# Patient Record
Sex: Male | Born: 1945 | Race: White | Hispanic: No | Marital: Married | Smoking: Former smoker
Health system: Southern US, Community
[De-identification: ages and names within clinical notes are randomized; demographics above are authoritative.]

## PROBLEM LIST (undated history)

## (undated) DIAGNOSIS — I739 Peripheral vascular disease, unspecified: Secondary | ICD-10-CM

## (undated) DIAGNOSIS — E785 Hyperlipidemia, unspecified: Secondary | ICD-10-CM

## (undated) DIAGNOSIS — J449 Chronic obstructive pulmonary disease, unspecified: Secondary | ICD-10-CM

## (undated) DIAGNOSIS — F32A Depression, unspecified: Secondary | ICD-10-CM

## (undated) DIAGNOSIS — IMO0002 Reserved for concepts with insufficient information to code with codable children: Secondary | ICD-10-CM

## (undated) DIAGNOSIS — D689 Coagulation defect, unspecified: Secondary | ICD-10-CM

## (undated) DIAGNOSIS — S8291XA Unspecified fracture of right lower leg, initial encounter for closed fracture: Secondary | ICD-10-CM

## (undated) DIAGNOSIS — I82409 Acute embolism and thrombosis of unspecified deep veins of unspecified lower extremity: Secondary | ICD-10-CM

## (undated) DIAGNOSIS — F329 Major depressive disorder, single episode, unspecified: Secondary | ICD-10-CM

## (undated) DIAGNOSIS — F419 Anxiety disorder, unspecified: Secondary | ICD-10-CM

## (undated) DIAGNOSIS — I2510 Atherosclerotic heart disease of native coronary artery without angina pectoris: Secondary | ICD-10-CM

## (undated) DIAGNOSIS — S8292XA Unspecified fracture of left lower leg, initial encounter for closed fracture: Secondary | ICD-10-CM

## (undated) HISTORY — DX: Hyperlipidemia, unspecified: E78.5

## (undated) HISTORY — DX: Unspecified fracture of left lower leg, initial encounter for closed fracture: S82.92XA

## (undated) HISTORY — PX: PR VEIN BYPASS GRAFT,AORTO-FEM-POP: 35551

## (undated) HISTORY — DX: Peripheral vascular disease, unspecified: I73.9

## (undated) HISTORY — PX: NECK SURGERY: SHX720

## (undated) HISTORY — DX: Coagulation defect, unspecified: D68.9

## (undated) HISTORY — DX: Reserved for concepts with insufficient information to code with codable children: IMO0002

## (undated) HISTORY — DX: Acute embolism and thrombosis of unspecified deep veins of unspecified lower extremity: I82.409

## (undated) HISTORY — DX: Unspecified fracture of right lower leg, initial encounter for closed fracture: S82.91XA

## (undated) HISTORY — PX: FRACTURE SURGERY: SHX138

## (undated) HISTORY — PX: OTHER SURGICAL HISTORY: SHX169

## (undated) HISTORY — DX: Atherosclerotic heart disease of native coronary artery without angina pectoris: I25.10

---

## 1998-06-06 ENCOUNTER — Encounter: Payer: Self-pay | Admitting: *Deleted

## 1998-06-06 ENCOUNTER — Ambulatory Visit (HOSPITAL_COMMUNITY): Admission: RE | Admit: 1998-06-06 | Discharge: 1998-06-06 | Payer: Self-pay | Admitting: *Deleted

## 2003-11-15 ENCOUNTER — Ambulatory Visit (HOSPITAL_COMMUNITY): Admission: RE | Admit: 2003-11-15 | Discharge: 2003-11-16 | Payer: Self-pay | Admitting: Neurosurgery

## 2010-07-21 ENCOUNTER — Ambulatory Visit: Payer: Self-pay | Admitting: Cardiovascular Disease

## 2010-07-24 ENCOUNTER — Ambulatory Visit: Payer: Self-pay | Admitting: Vascular Surgery

## 2010-07-27 ENCOUNTER — Ambulatory Visit: Payer: Self-pay | Admitting: Cardiovascular Disease

## 2010-07-30 ENCOUNTER — Telehealth (INDEPENDENT_AMBULATORY_CARE_PROVIDER_SITE_OTHER): Payer: Self-pay | Admitting: *Deleted

## 2010-07-30 ENCOUNTER — Ambulatory Visit (HOSPITAL_COMMUNITY)
Admission: RE | Admit: 2010-07-30 | Discharge: 2010-07-30 | Payer: Self-pay | Source: Home / Self Care | Admitting: Vascular Surgery

## 2010-07-30 ENCOUNTER — Ambulatory Visit: Payer: Self-pay | Admitting: Vascular Surgery

## 2010-07-30 HISTORY — PX: OTHER SURGICAL HISTORY: SHX169

## 2010-08-03 ENCOUNTER — Encounter: Payer: Self-pay | Admitting: *Deleted

## 2010-08-03 ENCOUNTER — Ambulatory Visit: Payer: Self-pay | Admitting: Cardiology

## 2010-08-03 ENCOUNTER — Encounter: Payer: Self-pay | Admitting: Cardiovascular Disease

## 2010-08-03 ENCOUNTER — Encounter (HOSPITAL_COMMUNITY)
Admission: RE | Admit: 2010-08-03 | Discharge: 2010-10-17 | Payer: Self-pay | Source: Home / Self Care | Attending: Cardiovascular Disease | Admitting: Cardiovascular Disease

## 2010-08-03 ENCOUNTER — Ambulatory Visit: Payer: Self-pay

## 2010-08-03 HISTORY — PX: CARDIOVASCULAR STRESS TEST: SHX262

## 2010-08-07 ENCOUNTER — Ambulatory Visit: Payer: Self-pay | Admitting: Vascular Surgery

## 2010-08-17 ENCOUNTER — Encounter: Payer: Self-pay | Admitting: Vascular Surgery

## 2010-08-17 ENCOUNTER — Inpatient Hospital Stay (HOSPITAL_COMMUNITY)
Admission: RE | Admit: 2010-08-17 | Discharge: 2010-08-25 | Payer: Self-pay | Source: Home / Self Care | Admitting: Vascular Surgery

## 2010-08-19 ENCOUNTER — Ambulatory Visit: Payer: Self-pay | Admitting: Oncology

## 2010-08-19 ENCOUNTER — Encounter: Payer: Self-pay | Admitting: Vascular Surgery

## 2010-08-19 ENCOUNTER — Ambulatory Visit: Payer: Self-pay | Admitting: Vascular Surgery

## 2010-08-20 ENCOUNTER — Encounter: Payer: Self-pay | Admitting: Vascular Surgery

## 2010-08-25 ENCOUNTER — Ambulatory Visit: Payer: Self-pay | Admitting: Oncology

## 2010-08-28 ENCOUNTER — Ambulatory Visit: Payer: Self-pay | Admitting: Oncology

## 2010-08-28 ENCOUNTER — Ambulatory Visit: Payer: Self-pay | Admitting: Vascular Surgery

## 2010-08-28 LAB — PROTIME-INR: Protime: 36 Seconds — ABNORMAL HIGH (ref 10.6–13.4)

## 2010-08-31 LAB — CBC WITH DIFFERENTIAL/PLATELET
BASO%: 0.4 % (ref 0.0–2.0)
Basophils Absolute: 0.1 10*3/uL (ref 0.0–0.1)
LYMPH%: 17.4 % (ref 14.0–49.0)
MCH: 30.6 pg (ref 27.2–33.4)
MCHC: 33.4 g/dL (ref 32.0–36.0)
MCV: 91.4 fL (ref 79.3–98.0)
MONO#: 1.1 10*3/uL — ABNORMAL HIGH (ref 0.1–0.9)
NEUT#: 10.3 10*3/uL — ABNORMAL HIGH (ref 1.5–6.5)
NEUT%: 72.4 % (ref 39.0–75.0)
lymph#: 2.5 10*3/uL (ref 0.9–3.3)

## 2010-08-31 LAB — PROTIME-INR: INR: 1.6 — ABNORMAL LOW (ref 2.00–3.50)

## 2010-09-03 LAB — PROTIME-INR: INR: 2 (ref 2.00–3.50)

## 2010-09-04 ENCOUNTER — Encounter
Admission: RE | Admit: 2010-09-04 | Discharge: 2010-09-04 | Payer: Self-pay | Source: Home / Self Care | Admitting: Vascular Surgery

## 2010-09-04 ENCOUNTER — Ambulatory Visit: Payer: Self-pay | Admitting: Vascular Surgery

## 2010-09-09 LAB — PROTIME-INR: Protime: 28.8 Seconds — ABNORMAL HIGH (ref 10.6–13.4)

## 2010-09-15 LAB — CBC WITH DIFFERENTIAL/PLATELET
BASO%: 0.9 % (ref 0.0–2.0)
EOS%: 3.1 % (ref 0.0–7.0)
HCT: 39.1 % (ref 38.4–49.9)
LYMPH%: 29.6 % (ref 14.0–49.0)
MCH: 30.1 pg (ref 27.2–33.4)
MCHC: 33.2 g/dL (ref 32.0–36.0)
MCV: 90.5 fL (ref 79.3–98.0)
MONO%: 12.7 % (ref 0.0–14.0)
NEUT%: 53.7 % (ref 39.0–75.0)
Platelets: 175 10*3/uL (ref 140–400)
RBC: 4.32 10*6/uL (ref 4.20–5.82)
WBC: 6.4 10*3/uL (ref 4.0–10.3)

## 2010-09-15 LAB — PROTIME-INR: Protime: 25.2 Seconds — ABNORMAL HIGH (ref 10.6–13.4)

## 2010-09-18 ENCOUNTER — Ambulatory Visit: Payer: Self-pay | Admitting: Vascular Surgery

## 2010-10-23 ENCOUNTER — Ambulatory Visit
Admission: RE | Admit: 2010-10-23 | Discharge: 2010-10-23 | Payer: Self-pay | Source: Home / Self Care | Attending: Vascular Surgery | Admitting: Vascular Surgery

## 2010-10-23 ENCOUNTER — Ambulatory Visit (HOSPITAL_COMMUNITY)
Admission: RE | Admit: 2010-10-23 | Discharge: 2010-10-23 | Payer: Self-pay | Source: Home / Self Care | Attending: Vascular Surgery | Admitting: Vascular Surgery

## 2010-10-23 ENCOUNTER — Ambulatory Visit: Admit: 2010-10-23 | Payer: Self-pay | Admitting: Vascular Surgery

## 2010-10-27 ENCOUNTER — Encounter: Payer: Self-pay | Admitting: Vascular Surgery

## 2010-10-27 ENCOUNTER — Inpatient Hospital Stay (HOSPITAL_COMMUNITY)
Admission: RE | Admit: 2010-10-27 | Discharge: 2010-10-30 | Payer: Self-pay | Source: Home / Self Care | Attending: Vascular Surgery | Admitting: Vascular Surgery

## 2010-10-27 HISTORY — PX: OTHER SURGICAL HISTORY: SHX169

## 2010-10-28 ENCOUNTER — Encounter: Payer: Self-pay | Admitting: Vascular Surgery

## 2010-11-02 LAB — CBC
HCT: 45.6 % (ref 39.0–52.0)
HCT: 33.5 % — ABNORMAL LOW (ref 39.0–52.0)
HCT: 34.3 % — ABNORMAL LOW (ref 39.0–52.0)
HCT: 33.8 % — ABNORMAL LOW (ref 39.0–52.0)
Hemoglobin: 15.4 g/dL (ref 13.0–17.0)
Hemoglobin: 11.2 g/dL — ABNORMAL LOW (ref 13.0–17.0)
Hemoglobin: 11.5 g/dL — ABNORMAL LOW (ref 13.0–17.0)
Hemoglobin: 11.2 g/dL — ABNORMAL LOW (ref 13.0–17.0)
MCH: 30.3 pg (ref 26.0–34.0)
MCH: 29.3 pg (ref 26.0–34.0)
MCH: 29.3 pg (ref 26.0–34.0)
MCH: 29.8 pg (ref 26.0–34.0)
MCHC: 33.8 g/dL (ref 30.0–36.0)
MCHC: 33.4 g/dL (ref 30.0–36.0)
MCHC: 33.5 g/dL (ref 30.0–36.0)
MCHC: 33.1 g/dL (ref 30.0–36.0)
MCV: 89.6 fL (ref 78.0–100.0)
MCV: 87.7 fL (ref 78.0–100.0)
MCV: 87.5 fL (ref 78.0–100.0)
MCV: 89.9 fL (ref 78.0–100.0)
Platelets: 246 10*3/uL (ref 150–400)
Platelets: 165 10*3/uL (ref 150–400)
Platelets: 142 10*3/uL — ABNORMAL LOW (ref 150–400)
Platelets: 157 10*3/uL (ref 150–400)
RBC: 5.09 MIL/uL (ref 4.22–5.81)
RBC: 3.82 MIL/uL — ABNORMAL LOW (ref 4.22–5.81)
RBC: 3.92 MIL/uL — ABNORMAL LOW (ref 4.22–5.81)
RBC: 3.76 MIL/uL — ABNORMAL LOW (ref 4.22–5.81)
RDW: 13.2 % (ref 11.5–15.5)
RDW: 14 % (ref 11.5–15.5)
RDW: 14.3 % (ref 11.5–15.5)
RDW: 14.3 % (ref 11.5–15.5)
WBC: 9.7 10*3/uL (ref 4.0–10.5)
WBC: 11.4 10*3/uL — ABNORMAL HIGH (ref 4.0–10.5)
WBC: 9.4 10*3/uL (ref 4.0–10.5)
WBC: 11.1 10*3/uL — ABNORMAL HIGH (ref 4.0–10.5)

## 2010-11-02 LAB — WOUND CULTURE
Culture: NO GROWTH
Culture: NO GROWTH

## 2010-11-02 LAB — CROSSMATCH
ABO/RH(D): B POS
ABO/RH(D): B POS
Antibody Screen: NEGATIVE
Antibody Screen: NEGATIVE
Unit division: 0
Unit division: 0
Unit division: 0
Unit division: 0
Unit division: 0
Unit division: 0

## 2010-11-02 LAB — BASIC METABOLIC PANEL
BUN: 10 mg/dL (ref 6–23)
BUN: 9 mg/dL (ref 6–23)
BUN: 6 mg/dL (ref 6–23)
CO2: 24 mEq/L (ref 19–32)
CO2: 27 mEq/L (ref 19–32)
CO2: 28 mEq/L (ref 19–32)
Calcium: 7.7 mg/dL — ABNORMAL LOW (ref 8.4–10.5)
Calcium: 7.7 mg/dL — ABNORMAL LOW (ref 8.4–10.5)
Calcium: 8.1 mg/dL — ABNORMAL LOW (ref 8.4–10.5)
Chloride: 109 mEq/L (ref 96–112)
Chloride: 104 mEq/L (ref 96–112)
Chloride: 103 mEq/L (ref 96–112)
Creatinine, Ser: 0.88 mg/dL (ref 0.4–1.5)
Creatinine, Ser: 0.8 mg/dL (ref 0.4–1.5)
Creatinine, Ser: 0.91 mg/dL (ref 0.4–1.5)
GFR calc Af Amer: >60 mL/min (ref >60)
GFR calc Af Amer: >60 mL/min (ref >60)
GFR calc Af Amer: >60 mL/min (ref >60)
GFR calc non Af Amer: >60 mL/min (ref >60)
GFR calc non Af Amer: >60 mL/min (ref >60)
GFR calc non Af Amer: >60 mL/min (ref >60)
Glucose, Bld: 132 mg/dL — ABNORMAL HIGH (ref 70–99)
Glucose, Bld: 128 mg/dL — ABNORMAL HIGH (ref 70–99)
Glucose, Bld: 156 mg/dL — ABNORMAL HIGH (ref 70–99)
Potassium: 4.3 mEq/L (ref 3.5–5.1)
Potassium: 3.8 mEq/L (ref 3.5–5.1)
Potassium: 4.2 mEq/L (ref 3.5–5.1)
Sodium: 136 mEq/L (ref 135–145)
Sodium: 135 mEq/L (ref 135–145)
Sodium: 135 mEq/L (ref 135–145)

## 2010-11-02 LAB — POCT I-STAT 4, (NA,K, GLUC, HGB,HCT)
Glucose, Bld: 131 mg/dL — ABNORMAL HIGH (ref 70–99)
HCT: 32 % — ABNORMAL LOW (ref 39.0–52.0)
Hemoglobin: 10.9 g/dL — ABNORMAL LOW (ref 13.0–17.0)
Potassium: 4.8 mEq/L (ref 3.5–5.1)
Sodium: 139 mEq/L (ref 135–145)

## 2010-11-02 LAB — COMPREHENSIVE METABOLIC PANEL
ALT: 35 U/L (ref 0–53)
AST: 31 U/L (ref 0–37)
Albumin: 4.3 g/dL (ref 3.5–5.2)
Alkaline Phosphatase: 71 U/L (ref 39–117)
BUN: 8 mg/dL (ref 6–23)
CO2: 23 mEq/L (ref 19–32)
Calcium: 9.6 mg/dL (ref 8.4–10.5)
Chloride: 107 mEq/L (ref 96–112)
Creatinine, Ser: 1.02 mg/dL (ref 0.4–1.5)
GFR calc Af Amer: >60 mL/min (ref >60)
GFR calc non Af Amer: >60 mL/min (ref >60)
Glucose, Bld: 113 mg/dL — ABNORMAL HIGH (ref 70–99)
Potassium: 4.5 mEq/L (ref 3.5–5.1)
Sodium: 140 mEq/L (ref 135–145)
Total Bilirubin: 0.8 mg/dL (ref 0.3–1.2)
Total Protein: 7.7 g/dL (ref 6.0–8.3)

## 2010-11-02 LAB — URINALYSIS, ROUTINE W REFLEX MICROSCOPIC
Bilirubin Urine: NEGATIVE
Hgb urine dipstick: NEGATIVE
Ketones, ur: NEGATIVE mg/dL
Nitrite: NEGATIVE
Protein, ur: NEGATIVE mg/dL
Specific Gravity, Urine: 1.012 (ref 1.005–1.030)
Urine Glucose, Fasting: NEGATIVE mg/dL
Urobilinogen, UA: 0.2 mg/dL (ref 0.0–1.0)
pH: 5.5 (ref 5.0–8.0)

## 2010-11-02 LAB — PROTIME-INR
INR: 1.95 — ABNORMAL HIGH (ref 0.00–1.49)
INR: 1.12 (ref 0.00–1.49)
INR: 1.37 (ref 0.00–1.49)
INR: 1.36 (ref 0.00–1.49)
INR: 1.4 (ref 0.00–1.49)
Prothrombin Time: 22.4 seconds — ABNORMAL HIGH (ref 11.6–15.2)
Prothrombin Time: 14.6 seconds (ref 11.6–15.2)
Prothrombin Time: 17.1 seconds — ABNORMAL HIGH (ref 11.6–15.2)
Prothrombin Time: 17 seconds — ABNORMAL HIGH (ref 11.6–15.2)
Prothrombin Time: 17.4 seconds — ABNORMAL HIGH (ref 11.6–15.2)

## 2010-11-02 LAB — ANAEROBIC CULTURE

## 2010-11-02 LAB — GRAM STAIN

## 2010-11-02 LAB — SURGICAL PCR SCREEN
MRSA, PCR: NEGATIVE
Staphylococcus aureus: NEGATIVE

## 2010-11-02 LAB — APTT
aPTT: 37 seconds (ref 24–37)
aPTT: 31 seconds (ref 24–37)

## 2010-11-13 ENCOUNTER — Ambulatory Visit: Admit: 2010-11-13 | Payer: Self-pay | Admitting: Vascular Surgery

## 2010-11-13 ENCOUNTER — Ambulatory Visit
Admission: RE | Admit: 2010-11-13 | Discharge: 2010-11-13 | Payer: Self-pay | Source: Home / Self Care | Attending: Vascular Surgery | Admitting: Vascular Surgery

## 2010-11-13 NOTE — Assessment & Plan Note (Addendum)
OFFICE VISIT  [name], [name] DOB:  [birth year]                                       11/13/2010 ZOXWR#:[id number]  This is a postop followup.  HISTORY OF PRESENT ILLNESS:  This is a 65 year old gentleman that recently underwent a redo right to left femoral-femoral bypass and ileal femoral endarterectomy of the right common femoral artery with bovine patch angioplasty.  This was an extremely difficult procedure due to extensive amount of scarring in bilateral groins.  The patient at this point has some improved symptomatology in his feet.  He no longer has rest pain, and has warm feet at night per the patient and is able to ambulate and is returning to work.  No wound issues noted per the patient.  PHYSICAL EXAMINATION:  Vital signs:  Today he had a blood pressure 130/73, heart rate of 79, respirations were 12, satting 94% on room air. On focused exam bilateral groins are healing well.  The Dermabond is still in place.  There is no erythema in either groin.  There is a palpable femoral-femoral pulse.  In his right leg there is a weakly palpable DP and posterior tibial pulse.  I think I feel a weak posterior tibial on the left but no dorsalis pedis.  NONINVASIVE VASCULAR IMAGING:  He had on the right side ABI of 0.68, on the left 0.54.  Monophasic waveforms in both legs.  This is improved from the hospital but the right is decreased from preoperatively likely reflecting the significant disease that was found in the right common femoral artery during the operation and need for reconstruction with bovine patch.  I am also a little bit concerned that with this dropoff in fact the patient now has some inflow disease in the right iliac system.  MEDICAL DECISION MAKING:  This 58 year gentleman  patient now has undergone 3 sets of operations in an approximate 4 months' time period. I would not subject him to an additional procedure at this point and give him  a minimum of 6-12 months to recover completely.  At that point, I would complete a repeat angiogram to determine the extent of his disease. He may require additional procedure including an aortobifemoral bypass if the right iliac system is now compromised.  We discussed this plan.  The patient is going to continue with his anticoagulation for his hypercoagulable conditions.  In 6 months we will repeat the duplexes and an ABI on both legs.  Hopefully this patient with medical management will have healing of his arteries with improved flow in both legs.  At this point he does not have rest pain so I would not subject him to any additional procedures at this point.    Dennis Hertz, MD Electronically Signed  BLC/MEDQ  D:  11/13/2010  T:  11/13/2010  Job:  [id number]

## 2010-11-17 NOTE — Progress Notes (Signed)
Summary: Nuc Pre-Procedure  Phone Note Outgoing Call   Call placed by: Antionette Char RN,  July 30, 2010 4:46 PM Call placed to: Patient Reason for Call: Confirm/change Appt Summary of Call: Left message with information on [employer] Information Sheet (see scanned document for details).     Nuclear Med Background Indications for Stress Test: Evaluation for Ischemia        Nuclear Pre-Procedure Cardiac Risk Factors: Claudication, Lipids, PVD, Smoker

## 2010-11-17 NOTE — Assessment & Plan Note (Addendum)
Summary: Cardiology Nuclear Testing  Nuclear Med Background Indications for Stress Test: Evaluation for Ischemia    History Comments: No documented CAD. h/o PVD  Symptoms: DOE, Fatigue    Nuclear Pre-Procedure Cardiac Risk Factors: Claudication, Family History - CAD, Lipids, PVD, Smoker Caffeine/Decaff Intake: None NPO After: 9:30 PM Lungs: Diminished, but no wheezing.  O2 Sat 95% on RA. IV 0.9% NS with Angio Cath: 22g     IV Site: R Antecubital IV Started by: Irean Hong, RN Chest Size (in) 40-42     Height (in): 70 Weight (lb): 155 BMI: 22.32  Nuclear Med Study 1 or 2 day study:  1 day     Stress Test Type:  Eugenie Birks Reading MD:  Cassell Clement, MD     Referring MD:  Kristeen Miss, MD Resting Radionuclide:  Technetium 59m Tetrofosmin     Resting Radionuclide Dose:  11 mCi  Stress Radionuclide:  Technetium 52m Tetrofosmin     Stress Radionuclide Dose:  33 mCi   Stress Protocol   Lexiscan: 0.4 mg   Stress Test Technologist:  Rea [employer], CMA-N     Nuclear Technologist:  Doyne Keel, CNMT  Rest Procedure  Myocardial perfusion imaging was performed at rest 45 minutes following the intravenous administration of Technetium 8m Tetrofosmin.  Stress Procedure  The patient received IV Lexiscan 0.4 mg over 15-seconds.  Technetium 35m Tetrofosmin injected at 30-seconds.  There were no significant changes with infusion, isolated PVC.  Quantitative spect images were obtained after a 45 minute delay.  QPS Raw Data Images:  Normal; no motion artifact; normal heart/lung ratio. Stress Images:  Normal homogeneous uptake in all areas of the myocardium. Rest Images:  Normal homogeneous uptake in all areas of the myocardium. Subtraction (SDS):  No evidence of ischemia. Transient Ischemic Dilatation:  1.11  (Normal <1.22)  Lung/Heart Ratio:  0.35  (Normal <0.45)  Quantitative Gated Spect Images QGS EDV:  91 ml QGS ESV:  43 ml QGS EF:  53 % QGS cine images:  No wall motion  abnormalities.  Findings Normal nuclear study      Overall Impression  Exercise Capacity: Lexiscan with no exercise. BP Response: Normal blood pressure response. Clinical Symptoms: No chest pain ECG Impression: No significant ST segment change suggestive of ischemia. Overall Impression: Normal stress nuclear study. Overall Impression Comments: Good LV systolic function.  No ischemia.

## 2010-11-24 ENCOUNTER — Inpatient Hospital Stay (HOSPITAL_COMMUNITY)
Admission: AD | Admit: 2010-11-24 | Discharge: 2010-12-01 | DRG: 416 | Disposition: A | Discharge status: Home Health | Payer: BC Managed Care – PPO | Source: Ambulatory Visit | Attending: Vascular Surgery | Admitting: Vascular Surgery

## 2010-11-24 ENCOUNTER — Ambulatory Visit (INDEPENDENT_AMBULATORY_CARE_PROVIDER_SITE_OTHER): Payer: BC Managed Care – HMO | Admitting: Vascular Surgery

## 2010-11-24 DIAGNOSIS — D7582 Heparin induced thrombocytopenia (HIT): Secondary | ICD-10-CM | POA: Diagnosis present

## 2010-11-24 DIAGNOSIS — E785 Hyperlipidemia, unspecified: Secondary | ICD-10-CM | POA: Diagnosis present

## 2010-11-24 DIAGNOSIS — D75829 Heparin-induced thrombocytopenia, unspecified: Secondary | ICD-10-CM | POA: Diagnosis present

## 2010-11-24 DIAGNOSIS — T827XXA Infection and inflammatory reaction due to other cardiac and vascular devices, implants and grafts, initial encounter: Principal | ICD-10-CM | POA: Diagnosis present

## 2010-11-24 DIAGNOSIS — J449 Chronic obstructive pulmonary disease, unspecified: Secondary | ICD-10-CM | POA: Diagnosis present

## 2010-11-24 DIAGNOSIS — Y832 Surgical operation with anastomosis, bypass or graft as the cause of abnormal reaction of the patient, or of later complication, without mention of misadventure at the time of the procedure: Secondary | ICD-10-CM | POA: Diagnosis present

## 2010-11-24 DIAGNOSIS — I739 Peripheral vascular disease, unspecified: Secondary | ICD-10-CM | POA: Diagnosis present

## 2010-11-24 DIAGNOSIS — J4489 Other specified chronic obstructive pulmonary disease: Secondary | ICD-10-CM | POA: Diagnosis present

## 2010-11-24 DIAGNOSIS — T82898A Other specified complication of vascular prosthetic devices, implants and grafts, initial encounter: Secondary | ICD-10-CM

## 2010-11-24 LAB — URINALYSIS, ROUTINE W REFLEX MICROSCOPIC
Bilirubin Urine: NEGATIVE
Hgb urine dipstick: NEGATIVE
Ketones, ur: NEGATIVE mg/dL
Nitrite: NEGATIVE
Protein, ur: NEGATIVE mg/dL
Specific Gravity, Urine: 1.013 (ref 1.005–1.030)
Urine Glucose, Fasting: NEGATIVE mg/dL
Urobilinogen, UA: 0.2 mg/dL (ref 0.0–1.0)
pH: 6 (ref 5.0–8.0)

## 2010-11-24 LAB — BASIC METABOLIC PANEL
BUN: 8 mg/dL (ref 6–23)
CO2: 25 mEq/L (ref 19–32)
Calcium: 9.5 mg/dL (ref 8.4–10.5)
Chloride: 105 mEq/L (ref 96–112)
Creatinine, Ser: 0.74 mg/dL (ref 0.4–1.5)
GFR calc Af Amer: >60 mL/min (ref >60)
GFR calc non Af Amer: >60 mL/min (ref >60)
Potassium: 3.9 mEq/L (ref 3.5–5.1)
Sodium: 138 mEq/L (ref 135–145)

## 2010-11-24 LAB — CBC
HCT: 42 % (ref 39.0–52.0)
Hemoglobin: 14.4 g/dL (ref 13.0–17.0)
MCH: 29.8 pg (ref 26.0–34.0)
MCHC: 34.3 g/dL (ref 30.0–36.0)
Platelets: 231 10*3/uL (ref 150–400)
RBC: 4.84 MIL/uL (ref 4.22–5.81)
RDW: 13.3 % (ref 11.5–15.5)
WBC: 10.6 10*3/uL — ABNORMAL HIGH (ref 4.0–10.5)

## 2010-11-24 LAB — PROTIME-INR
INR: 2.64 — ABNORMAL HIGH (ref 0.00–1.49)
Prothrombin Time: 28.3 seconds — ABNORMAL HIGH (ref 11.6–15.2)

## 2010-11-25 DIAGNOSIS — I739 Peripheral vascular disease, unspecified: Secondary | ICD-10-CM

## 2010-11-25 DIAGNOSIS — Z01810 Encounter for preprocedural cardiovascular examination: Secondary | ICD-10-CM

## 2010-11-26 LAB — CBC
HCT: 38.8 % — ABNORMAL LOW (ref 39.0–52.0)
Hemoglobin: 12.8 g/dL — ABNORMAL LOW (ref 13.0–17.0)
MCH: 29.5 pg (ref 26.0–34.0)
MCV: 89.4 fL (ref 78.0–100.0)
Platelets: 207 10*3/uL (ref 150–400)
RBC: 4.34 MIL/uL (ref 4.22–5.81)
WBC: 9.3 10*3/uL (ref 4.0–10.5)

## 2010-11-26 LAB — PROTIME-INR
INR: 1.56 — ABNORMAL HIGH (ref 0.00–1.49)
Prothrombin Time: 18.9 seconds — ABNORMAL HIGH (ref 11.6–15.2)

## 2010-11-27 LAB — CBC
HCT: 38.2 % — ABNORMAL LOW (ref 39.0–52.0)
MCHC: 31.9 g/dL (ref 30.0–36.0)
Platelets: 213 10*3/uL (ref 150–400)
RBC: 4.31 MIL/uL (ref 4.22–5.81)
RDW: 13.5 % (ref 11.5–15.5)
WBC: 8.5 10*3/uL (ref 4.0–10.5)

## 2010-11-27 LAB — BASIC METABOLIC PANEL
Calcium: 8.7 mg/dL (ref 8.4–10.5)
Chloride: 106 mEq/L (ref 96–112)
GFR calc Af Amer: >60 mL/min (ref >60)
GFR calc non Af Amer: >60 mL/min (ref >60)
Potassium: 4 mEq/L (ref 3.5–5.1)
Sodium: 139 mEq/L (ref 135–145)

## 2010-11-27 NOTE — H&P (Signed)
HISTORY AND PHYSICAL EXAMINATION  November 24, 2010  Re:  Dennis, Webster               DOB:  1946/07/31  REASON FOR ADMISSION:  Rule out infected right-to-left femoral-femoral bypass.  HISTORY OF PRESENT ILLNESS:  A 65 year old male patient well known to our practice, having had 3 previous surgical procedures by Dr. Imogene Burn. Initially he had a right-to-left femoral-femoral and a left femoral- popliteal bypass graft.  The left femoral-popliteal graft occluded, and eventually he developed thrombosis of the right to left femoral-femoral bypass.  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  He had insertion of a right-to-left femoral-femoral Dacron graft on January 10 and since that time has had relatively stable ABIs of 0.68 on the right and 0.54 on the left with claudication in both calves, which is tolerable.  Today he returns to the office with swelling and tenderness in the right suprapubic area, which has been present for 24 hours.  He denies chills and fever but does state that he was cold last night, and his legs felt very warm.  CHRONIC MEDICAL PROBLEMS: 1. Hypertension. 2. Hyperlipidemia. 3. Degenerative disk disease. 4. History of multiple lower extremity fractures. 5. History of heparin-induced thrombocytopenia previous admission.  SOCIAL HISTORY:  He is married with 2 children.  He is currently a IT consultant.  He has a greater than 40-pack-year history of smoking, continues to smoke.  Does not use alcohol.  FAMILY HISTORY:  Mother had cancer and emphysema.  Father's is unknown.  PHYSICAL EXAMINATION:  Temperature 97.5, blood pressure 143/85, heart rate 93, respirations 18.  General: He is a well-developed, well- nourished, thin male in no apparent distress.  Alert and oriented x3. HEENT:  Normal.  Lungs: Clear to auscultation.  No rhonchi or wheezing. Cardiovascular:  Regular rhythm.  No murmurs.  Abdomen:  Soft, nontender with no masses.  Musculoskeletal:   Free of major deformities.  Lower extremity exam reveals a 3+ pulse in the right-to-left femoral-femoral bypass.  There is diffuse erythema in the suprapubic area surrounding the graft.  There is no fluctuance noted.  The incisions themselves in the inguinal areas are not that inflamed.  Both feet are adequately perfused.  IMPRESSION:  Rule out infection of right-to-left femoral-femoral Dacron graft.  PLAN:  Admit the patient today for wide-spectrum antibiotics (Rocephin and vancomycin).  Dr. Imogene Burn will observe this to determine whether graft removal will be necessary or whether this is a cellulitic process that will respond to antibiotics.  Discussed this with the patient at length.    Quita Skye Hart Rochester, M.D. Electronically Signed  JDL/MEDQ  D:  11/24/2010  T:  11/25/2010  Job:  [id number]

## 2010-11-27 NOTE — H&P (Addendum)
  NAMETYQUARIUS, PAGLIA               ACCOUNT NO.:  [id number]  MEDICAL RECORD NO.:  [id number]           PATIENT TYPE:  LOCATION:                                 FACILITY:  PHYSICIAN:  [name], MD       DATE OF BIRTH:  22-Feb-1946  DATE OF ADMISSION: DATE OF DISCHARGE:                             HISTORY & PHYSICAL   CHIEF COMPLAINT:  Occluded fem-fem bypass.  HISTORY OF PRESENT ILLNESS:  Mr. Cybulski is a 65 year old gentleman with right to left fem-fem bypass and left femoral to above-knee popliteal bypass of vein on August 18, 2011, a few hours after completion was noted to have clotted graft, taken back to the operating room.  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  He had been on Coumadin but was found to have occluded fem-fem bypass.  He has returned for redo fem-fem bypass.  PAST MEDICAL HISTORY:  Significant for hypercholesterolemia and COPD with smoking history.  On physical exam, he is a well-developed, well-nourished gentleman in no acute distress.  He is alert and oriented x3.  Neurologically, he remains intact with good and equal strength in bilateral upper and lower extremities.  No difficulty speaking or swallowing.  Lungs are clear. Heart rate and rhythm was regular without murmur or rub.  Abdomen is soft and nontender.  Bilateral lower extremities are warm and well perfused with Doppler signal.  He had no pulse in his fem-fem graft.  Medications include Chantix 1 mg twice daily, Coumadin 2 tablets daily which is total 2 mg daily, Lipitor 40 mg daily.  ASSESSMENT/PLAN: 1. Occluded femoral-femoral bypass, status post claudication on the     left. 2. Heparin-induced thrombocytopenia positive in this patient who had     previously had a femoral-femoral bypass with Propaten heparin-     impregnated graft which is now occluded.  Plan is to remove the     Propaten graft and do a  right-to-left femoral-femoral bypass for     claudication.     Della Goo, PA-C   ______________________________ [name], MD    RR/MEDQ  D:  11/23/2010  T:  11/24/2010  Job:  [id number]  Electronically Signed by Della Goo PA on 11/27/2010 05:00:18 PM Electronically Signed by Leonides Sake MD on 12/02/2010 10:19:52 AM

## 2010-11-29 LAB — VANCOMYCIN, TROUGH: Vancomycin Tr: 10.2 ug/mL (ref 10.0–20.0)

## 2010-11-30 ENCOUNTER — Ambulatory Visit: Payer: Self-pay | Admitting: Cardiovascular Disease

## 2010-11-30 ENCOUNTER — Inpatient Hospital Stay (HOSPITAL_COMMUNITY): Payer: BC Managed Care – PPO

## 2010-11-30 DIAGNOSIS — T8579XA Infection and inflammatory reaction due to other internal prosthetic devices, implants and grafts, initial encounter: Secondary | ICD-10-CM

## 2010-12-01 LAB — DIFFERENTIAL
Basophils Absolute: 0.1 10*3/uL (ref 0.0–0.1)
Basophils Relative: 1 % (ref 0–1)
Eosinophils Absolute: 0.7 10*3/uL (ref 0.0–0.7)
Lymphocytes Relative: 22 % (ref 12–46)
Lymphs Abs: 2.3 10*3/uL (ref 0.7–4.0)
Neutro Abs: 6.5 10*3/uL (ref 1.7–7.7)
Neutrophils Relative %: 62 % (ref 43–77)

## 2010-12-01 LAB — COMPREHENSIVE METABOLIC PANEL
ALT: 21 U/L (ref 0–53)
AST: 17 U/L (ref 0–37)
Albumin: 3.4 g/dL — ABNORMAL LOW (ref 3.5–5.2)
Alkaline Phosphatase: 61 U/L (ref 39–117)
BUN: 12 mg/dL (ref 6–23)
CO2: 27 mEq/L (ref 19–32)
Calcium: 9.5 mg/dL (ref 8.4–10.5)
Chloride: 104 mEq/L (ref 96–112)
GFR calc Af Amer: >60 mL/min (ref >60)
GFR calc non Af Amer: >60 mL/min (ref >60)
Glucose, Bld: 87 mg/dL (ref 70–99)
Potassium: 4 mEq/L (ref 3.5–5.1)
Sodium: 139 mEq/L (ref 135–145)
Total Protein: 7.3 g/dL (ref 6.0–8.3)

## 2010-12-01 LAB — CBC
HCT: 41.6 % (ref 39.0–52.0)
Hemoglobin: 14 g/dL (ref 13.0–17.0)
MCHC: 33.7 g/dL (ref 30.0–36.0)
MCV: 87.9 fL (ref 78.0–100.0)
Platelets: 220 10*3/uL (ref 150–400)
RBC: 4.73 MIL/uL (ref 4.22–5.81)
RDW: 13.4 % (ref 11.5–15.5)
WBC: 10.6 10*3/uL — ABNORMAL HIGH (ref 4.0–10.5)

## 2010-12-01 LAB — CULTURE, BLOOD (ROUTINE X 2)
Culture  Setup Time: 201202082219
Culture  Setup Time: 201202082219
Culture: NO GROWTH
Culture: NO GROWTH

## 2010-12-01 LAB — PROTIME-INR
INR: 1.13 (ref 0.00–1.49)
Prothrombin Time: 14.7 seconds (ref 11.6–15.2)

## 2010-12-01 LAB — HIV ANTIBODY (ROUTINE TESTING W REFLEX): HIV: NONREACTIVE

## 2010-12-02 NOTE — Discharge Summary (Addendum)
[name], [name]               ACCOUNT NO.:  [id number]  MEDICAL RECORD NO.:  [id number]           PATIENT TYPE:  I  LOCATION:  2008                         FACILITY:  [facility]  PHYSICIAN:  [name], MD       DATE OF BIRTH:  [birth year]  DATE OF ADMISSION:  11/24/2010 DATE OF DISCHARGE:  12/01/2010                              DISCHARGE SUMMARY   HISTORY OF PRESENT ILLNESS:  [name] is a 65 year old gentleman who was admitted on November 24, 2010, with an infected femoral-femoral bypass graft.  [name] was admitted on November 24, 2010, being seen in the office by Dr. Hart Rochester.  The patient had a redo right-to-left fem-fem bypass on October 27, 2010.  He had been doing well, but returned to the clinic on November 24, 2010, with pain and swelling in both groins.  He denied night sweats, but he has been having chills and the leg has been feeling warm.  He had diffuse erythema surrounding the right-to-left femoral- femoral bypass graft.  He was admitted to the hospital for IV antibiotics and further assessment.  PAST MEDICAL HISTORY:  Significant for: 1. COPD. 2. Hyperlipidemia. 3. Bilateral peripheral vascular disease with left leg claudication     for which the patient had a and right-to-left femoral-femoral     bypass with Propaten in November 2011.  He returned when he also had a left femoral to above-knee popliteal bypass with greater saphenous vein in November as well.  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  He continued to have claudication and was brought back to the OR on October 27, 2010, for redo right-to-left fem-fem bypass with Dacron graft.  He also had iliofemoral endarterectomy to the right external iliac, common femoral artery and superficial femoral artery with bovine patch angioplasty.  Postoperatively, the patient did well. He was discharged to home, then he had come back in for this  admission with possible infected fem-fem bypass.  ALLERGIES:  HEPARIN.  HOSPITAL COURSE:  He was admitted to the hospital and started on IV antibiotics.  He remained afebrile.  His erythema resolved of the ensuing days.  He was ambulating without difficulty.  He had palpable femoral pulse and palpable pulse in the fem-fem bypass.  Infectious Disease was consulted by a day 3 of vancomycin and Zosyn as incisions looked good with no erythema.  PICC line was placed.  He is also continued on his Coumadin and Arixtra while in-house.  He was changed to vancomycin, Levaquin and rifampin for his antibiotics, and he was discharged with a PICC line on December 01, 2010.  He will have IV antibiotics until February 24 and follow up in clinic on that day.  He will also continue his Levaquin and rifampin by mouth until February 24 as well.  FINAL DIAGNOSIS:  Infected right-to-left femoral-femoral bypass, responding well to IV and p.o. antibiotics.  DISPOSITION: 1. The patient was discharged to home.  He will continue both his IV    and p.o. antibiotics until February 24.  He will also be seen in  the clinic on that day for further evaluation.  He will go home     with Home Health and a PICC line. 2. He will continue his Coumadin and Arixtra and have PT/INRs followed     weekly.  He may need increased dose of Coumadin secondary to his     bang on the rifampin.  DISCHARGE MEDICATIONS: 1. Arixtra 7.5 mg daily for 14 days. 2. Levaquin 750 mg daily. 3. P.o. oxycodone 5 mg 1-2 tablets every 4 hours as needed for pain. 4. Rifampin 300 mg twice daily. 5. Vancomycin 1 g twice daily IV. 6. PICC. 7. Chantix 1 mg, he has 2 doses left and then he will be finished with     that. 8. Coumadin will be on an as-needed dose, he used to take 2 mg every     day, but 3 mg on Thursday. 9. Lipitor 40 mg daily.  LABORATORY RESULTS:  His blood cultures were negative.  His PT/INR were 14.7 and 1.13 on 14th.  His  creatinine is 0.83.  His vancomycin trough on February 12 was 10.2.     Della Goo, PA-C   ______________________________ [name], MD    RR/MEDQ  D:  12/01/2010  T:  12/02/2010  Job:  [id number]  Electronically Signed by Leonides Sake MD on 12/02/2010 10:21:02 AM Electronically Signed by Della Goo PA on 12/03/2010 02:21:09 PM

## 2010-12-07 ENCOUNTER — Ambulatory Visit (INDEPENDENT_AMBULATORY_CARE_PROVIDER_SITE_OTHER): Payer: BC Managed Care – PPO | Admitting: Cardiovascular Disease

## 2010-12-07 DIAGNOSIS — I739 Peripheral vascular disease, unspecified: Secondary | ICD-10-CM

## 2010-12-07 DIAGNOSIS — I119 Hypertensive heart disease without heart failure: Secondary | ICD-10-CM

## 2010-12-07 DIAGNOSIS — E7800 Pure hypercholesterolemia, unspecified: Secondary | ICD-10-CM

## 2010-12-08 ENCOUNTER — Encounter: Payer: Self-pay | Admitting: Infectious Disease

## 2010-12-10 ENCOUNTER — Ambulatory Visit (INDEPENDENT_AMBULATORY_CARE_PROVIDER_SITE_OTHER): Payer: BC Managed Care – PPO | Admitting: Vascular Surgery

## 2010-12-10 DIAGNOSIS — M79609 Pain in unspecified limb: Secondary | ICD-10-CM

## 2010-12-11 ENCOUNTER — Ambulatory Visit: Payer: BC Managed Care – PPO | Admitting: Vascular Surgery

## 2010-12-11 NOTE — Assessment & Plan Note (Signed)
OFFICE VISIT  Dennis, MCEVERS DOB:  May 09, 1946                                       12/10/2010 ZOXWR#:[id number]  The patient presented to the office today complaining of left lower extremity pain and swelling.  He states he began to develop pain behind his left calf and left knee area yesterday.  He states this gets worse when walking but actually has pain at rest as well.  He recently had a femoral-femoral bypass performed by Dr. Imogene Burn and also was recently admitted for possible infection of his fem-fem bypass.  He was recently discharged from the hospital.  He denies any fever or chills.  He states the pain is confined to the left leg.  PHYSICAL EXAMINATION:  Blood pressure is 150/80 in the left arm, heart rate 96 and regular, temperature is 98.2.  He has an easily palpable graft pulse in the fem-fem bypass.  Both groins have healing incisions, with no surrounding erythema.  There is no surrounding edema in the groin incisions.  He does have some tenderness to palpation in the left popliteal fossa.  There are no palpable popliteal or pedal pulses in the left foot.  The left foot, however, is pink, warm, well-perfused and symmetric to the right foot.  He had a DVT ultrasound today which showed no evidence of DVT.  He did have evidence of a possible Baker's cyst.  However, I am not convinced that this is the cause of his pain.  I believe that the best option for him at this point would be continued ambulation.  Hopefully this will improve with time.  He does have known atherosclerotic disease below the level of his fem-fem bypass and at some point in the future may require an additional outflow procedure, and this will be at the discretion of Dr. Imogene Burn.  He had follow-up scheduled with Dr. Imogene Burn tomorrow.  We have cancelled this and scheduled him for follow-up appointment in a couple of weeks with Dr. Imogene Burn.    Dennis Hora. Bryahna Lesko, MD Electronically  Signed  CEF/MEDQ  D:  12/10/2010  T:  12/11/2010  Job:  [id number]  cc:   Dennis Hertz, MD

## 2010-12-18 NOTE — Procedures (Unsigned)
DUPLEX DEEP VENOUS EXAM - LOWER EXTREMITY  INDICATION:  Left lower extremity pain.  HISTORY:  Edema:  Yes, 1 day. Trauma/Surgery:  Left femoral-popliteal bypass graft on 10/27/2010. Pain:  Yes, 1 day. PE:  No. Previous DVT:  No. Anticoagulants:  Yes. Other:  DUPLEX EXAM:               CFV   SFV   PopV  PTV     GSV               R  L  R  L  R  L  R   L   R   L Thrombosis    o  o     o     o      o   NA  o Spontaneous   +  +     +     +      D Phasic        +  +     +     +      D Augmentation  +  +     +     +      + Compressible  +  +     +     +      + Competent     +  +     +     +      NA  Legend:  + - yes  o - no  p - partial  D - decreased  IMPRESSION: 1. No evidence of deep venous thrombosis identified involving the left     lower extremity. 2. Incidental note made of a mixed echogenic area at the popliteal     fossa which may represent a Baker cyst. 3. Another mixed echogenic area is noted in the mid posterior calf;     however, does not appear vascular in nature. 4. Contralateral common femoral vein appears patent.   _____________________________ Janetta Hora. Fields, MD  SH/MEDQ  D:  12/10/2010  T:  12/10/2010  Job:  [id number]

## 2010-12-24 NOTE — Consult Note (Signed)
NAMEABDULMALIK, DARCO NO.:  [id number]  MEDICAL RECORD NO.:  [id number]           PATIENT TYPE:  LOCATION:                                 FACILITY:  PHYSICIAN:  Acey Lav, MD  DATE OF BIRTH:  1945-10-24  DATE OF CONSULTATION: DATE OF DISCHARGE:                                CONSULTATION   REQUESTING PHYSICIAN:  [name], MD  REASON FOR INFECTIOUS DISEASE CONSULTATION:  Concern for infection around the fem-fem bypass graft.  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  He then returned to the Vascular Surgery office on the 7th with increased pain and swelling in the right suprapubic area.  He did not have chills or fevers, but he felt cold the night prior to admission.  He was admitted to the Vascular Surgery Service.  Blood cultures were obtained and he was placed on broad-spectrum antibiotics including vancomycin and Zosyn.  His blood cultures have been negative since then.  He has undergone vascular lab mapping done.  This showed concern for possible abscess per the report on the left femoral artery that was 4.5 x 1 cm in dimensions.  In the interim, his area of erythema and swelling on the right actually had improved dramatically and completely gone down and the erythema is resolved.  We are consulted to assist in management and workup of this patient with concern for graft infection on either side of his femoral arteries.  PAST MEDICAL  HISTORY: 1. Peripheral vascular disease complicated by as described above. 2. Heparin-induced thrombocytopenia. 3. Smoking. 4. COPD. 5. Hypertension. 6. Insomnia.  PAST SURGICAL HISTORY:  As described above.  Multiple vascular surgeries.  History of also anterior cervical decompression at C5-C6 and C6-C7 with allograft bone placement by Dr. Venetia Maxon in 2005.  FAMILY HISTORY:  Noncontributory.  SOCIAL HISTORY:  The patient is a former smoker, rare alcohol.  No other recreational drug use.  REVIEW OF SYSTEMS:  As described in the history of present illness, otherwise 12-point review of systems is negative.  ALLERGIES:  HEPARIN which induces HIT.  MEDICATIONS:  The patient is docusate, pantoprazole, Zosyn, vancomycin, Tylenol, Dulcolax, guaifenesin, dexamethorphan, hydralazine, Dilaudid, labetalol, Maalox, magnesium hydroxide, metoprolol, oxycodone, and Ambien.  PHYSICAL EXAMINATION:  VITAL SIGNS:  Temperature max in the last 24 hours 98.1, temperature currently 97, blood pressure 124/63, pulse 75, respirations 19, and pulse ox 95% on room air. GENERAL:  Quite pleasant gentleman, in no acute distress. HEENT:  Normocephalic and atraumatic.  Pupils are equal, round, and reactive to light.  Sclerae are anicteric.  Oropharynx is clear. NECK:  Supple. CARDIOVASCULAR:  Regular rate and rhythm.  No murmurs, gallops, or rubs. LUNGS:  Clear  to auscultation bilaterally without wheezing, rhonchi, or rales. ABDOMEN:  Soft and nondistended except the right suprapubic area where there is an area of induration there.  There is not much erythema present.  Left groin did not appear erythematous or swollen.  LABORATORY DATA:  Vascular study showing possible abscess around the left femoral artery.  FURTHER LABORATORY DATA: 1. Chest x-ray taken place with emphysematous changes. 2. Vancomycin trough 10.2. 3. Metabolic panel:  Sodium 139, potassium 4, chloride 106, bicarb 27,     BUN and  creatinine 13 and 0.93, glucose 155.  CBC with     differential:  White count 8.5, hemoglobin 12.2, and platelets 213.  MICROBIOLOGICAL DATA: 1. Blood cultures from the 8th, 2/2, no growth to date. 2. Wound cultures on October 27, 2010, on Zinacef, no growth.     Anaerobic cultures and wound cultures on October 27, 2010, no     growth.  Gram stain showed rare white blood cells, no organism.  IMPRESSION/RECOMMENDATIONS:  This is a 65 year old gentleman with a complication history of peripheral vascular disease status post grafting of both sides of his femoral arteries complicated by heparin-induced thrombocytopenia and thrombosis, now with concern for infection chronically based on swelling and erythema of his groin, but also with ABI study showing question of abscess in the left femoral arterial system. 1. Concern for deep abscesses associated with graft and femoral     arterial system.  I am going to talk to Dr. Imogene Burn again because of     the area on the left side was not one that he had mentioned to me     and this does not seem to be one chronic that had manifested     itself, but the ABI is indicating that there is a left-sided     abscess.  I wonder if this will be better imaged.  I would think it     will be better imaged by CT angiogram to evaluate both sides of the     femoral arteries and see if there is indeed an abscess on the left     side and see what is going on the right side as well.  Certainly,     if he has an infected graft, this will need to come out.  Although     I understand that the surgery will not be trivial given the     patient's course.  I agree with covering him for Gram positive     organisms such as methicillin-resistant Staphylococcus aureus and     streptococcal species and also for Gram negatives given the groin     being the site of the infection, I think we can simplify him with     vancomycin and levofloxacin at a dose of 750 daily.  I would like      to get more imaging of his anatomy to better delineate this. 2. Infection prevention:  I would put him on contact precautions given     we are treating him presumptively for MRSA infection. 3. Proton pump inhibitor use.  We would stop this if it is being used     for prophylaxis as we do not currently have     indication for stress ulcer prophylaxis. 4. Screening.  I will screen for HIV.  Thank you for infectious disease consultation.     Acey Lav, MD     CV/MEDQ  D:  11/30/2010  T:  12/01/2010  Job:  [id number]  Electronically Signed by Paulette Blanch DAM MD on 12/23/2010 11:20:04 AM

## 2010-12-24 NOTE — Medication Information (Signed)
Summary: [facility]: RX  [facility]: RX   Imported By: Florinda Marker 12/15/2010 11:39:33  _____________________________________________________________________  External Attachment:    Type:   Image     Comment:   External Document

## 2010-12-25 ENCOUNTER — Ambulatory Visit: Payer: BC Managed Care – PPO | Admitting: Vascular Surgery

## 2010-12-29 LAB — BASIC METABOLIC PANEL
BUN: 4 mg/dL — ABNORMAL LOW (ref 6–23)
BUN: 5 mg/dL — ABNORMAL LOW (ref 6–23)
BUN: 8 mg/dL (ref 6–23)
CO2: 28 mEq/L (ref 19–32)
CO2: 30 mEq/L (ref 19–32)
Calcium: 7.8 mg/dL — ABNORMAL LOW (ref 8.4–10.5)
Calcium: 8.4 mg/dL (ref 8.4–10.5)
Chloride: 103 mEq/L (ref 96–112)
Chloride: 105 mEq/L (ref 96–112)
Creatinine, Ser: 0.65 mg/dL (ref 0.4–1.5)
Creatinine, Ser: 0.68 mg/dL (ref 0.4–1.5)
Creatinine, Ser: 0.71 mg/dL (ref 0.4–1.5)
GFR calc Af Amer: >60 mL/min (ref >60)
GFR calc Af Amer: >60 mL/min (ref >60)
GFR calc non Af Amer: >60 mL/min (ref >60)
GFR calc non Af Amer: >60 mL/min (ref >60)
Glucose, Bld: 105 mg/dL — ABNORMAL HIGH (ref 70–99)
Glucose, Bld: 109 mg/dL — ABNORMAL HIGH (ref 70–99)
Glucose, Bld: 111 mg/dL — ABNORMAL HIGH (ref 70–99)
Potassium: 3.6 mEq/L (ref 3.5–5.1)
Potassium: 4 mEq/L (ref 3.5–5.1)
Potassium: 4.2 mEq/L (ref 3.5–5.1)
Sodium: 137 mEq/L (ref 135–145)

## 2010-12-29 LAB — HEPARIN INDUCED THROMBOCYTOPENIA PNL
Heparin Induced Plt Ab: NEGATIVE
Patient O.D.: 0.162
UFH Low Dose 0.1 IU/mL: 0 % Release

## 2010-12-29 LAB — PROTEIN S ACTIVITY: Protein S Activity: 46 % — ABNORMAL LOW (ref 69–129)

## 2010-12-29 LAB — DIFFERENTIAL
Basophils Absolute: 0 10*3/uL (ref 0.0–0.1)
Basophils Absolute: 0 10*3/uL (ref 0.0–0.1)
Basophils Absolute: 0 10*3/uL (ref 0.0–0.1)
Basophils Absolute: 0 10*3/uL (ref 0.0–0.1)
Basophils Absolute: 0 10*3/uL (ref 0.0–0.1)
Basophils Relative: 0 % (ref 0–1)
Basophils Relative: 0 % (ref 0–1)
Basophils Relative: 0 % (ref 0–1)
Basophils Relative: 0 % (ref 0–1)
Eosinophils Absolute: 0.2 10*3/uL (ref 0.0–0.7)
Eosinophils Absolute: 0.4 10*3/uL (ref 0.0–0.7)
Eosinophils Absolute: 0.4 10*3/uL (ref 0.0–0.7)
Eosinophils Relative: 3 % (ref 0–5)
Eosinophils Relative: 3 % (ref 0–5)
Lymphocytes Relative: 19 % (ref 12–46)
Lymphocytes Relative: 23 % (ref 12–46)
Lymphs Abs: 2.2 10*3/uL (ref 0.7–4.0)
Monocytes Absolute: 0.9 10*3/uL (ref 0.1–1.0)
Monocytes Absolute: 1.3 10*3/uL — ABNORMAL HIGH (ref 0.1–1.0)
Monocytes Absolute: 1.3 10*3/uL — ABNORMAL HIGH (ref 0.1–1.0)
Monocytes Absolute: 1.4 10*3/uL — ABNORMAL HIGH (ref 0.1–1.0)
Monocytes Relative: 11 % (ref 3–12)
Monocytes Relative: 11 % (ref 3–12)
Monocytes Relative: 11 % (ref 3–12)
Neutro Abs: 5.7 10*3/uL (ref 1.7–7.7)
Neutro Abs: 6.5 10*3/uL (ref 1.7–7.7)
Neutro Abs: 7.6 10*3/uL (ref 1.7–7.7)
Neutro Abs: 7.9 10*3/uL — ABNORMAL HIGH (ref 1.7–7.7)
Neutro Abs: 8.4 10*3/uL — ABNORMAL HIGH (ref 1.7–7.7)
Neutrophils Relative %: 67 % (ref 43–77)
Neutrophils Relative %: 72 % (ref 43–77)

## 2010-12-29 LAB — CBC
HCT: 28.5 % — ABNORMAL LOW (ref 39.0–52.0)
HCT: 28.7 % — ABNORMAL LOW (ref 39.0–52.0)
HCT: 30.7 % — ABNORMAL LOW (ref 39.0–52.0)
HCT: 31.2 % — ABNORMAL LOW (ref 39.0–52.0)
HCT: 31.6 % — ABNORMAL LOW (ref 39.0–52.0)
HCT: 31.9 % — ABNORMAL LOW (ref 39.0–52.0)
Hemoglobin: 10.2 g/dL — ABNORMAL LOW (ref 13.0–17.0)
Hemoglobin: 10.4 g/dL — ABNORMAL LOW (ref 13.0–17.0)
Hemoglobin: 10.9 g/dL — ABNORMAL LOW (ref 13.0–17.0)
Hemoglobin: 11 g/dL — ABNORMAL LOW (ref 13.0–17.0)
Hemoglobin: 9.7 g/dL — ABNORMAL LOW (ref 13.0–17.0)
MCH: 31.1 pg (ref 26.0–34.0)
MCH: 31.2 pg (ref 26.0–34.0)
MCH: 31.3 pg (ref 26.0–34.0)
MCH: 31.3 pg (ref 26.0–34.0)
MCH: 31.6 pg (ref 26.0–34.0)
MCHC: 33.2 g/dL (ref 30.0–36.0)
MCHC: 33.3 g/dL (ref 30.0–36.0)
MCHC: 33.8 g/dL (ref 30.0–36.0)
MCHC: 34 g/dL (ref 30.0–36.0)
MCHC: 34.2 g/dL (ref 30.0–36.0)
MCHC: 34.7 g/dL (ref 30.0–36.0)
MCV: 91.7 fL (ref 78.0–100.0)
MCV: 92.6 fL (ref 78.0–100.0)
MCV: 93.6 fL (ref 78.0–100.0)
Platelets: 125 10*3/uL — ABNORMAL LOW (ref 150–400)
Platelets: 167 10*3/uL (ref 150–400)
Platelets: 202 10*3/uL (ref 150–400)
RBC: 3.32 MIL/uL — ABNORMAL LOW (ref 4.22–5.81)
RBC: 3.48 MIL/uL — ABNORMAL LOW (ref 4.22–5.81)
RDW: 13 % (ref 11.5–15.5)
RDW: 13 % (ref 11.5–15.5)
RDW: 13 % (ref 11.5–15.5)
RDW: 13.2 % (ref 11.5–15.5)
RDW: 13.5 % (ref 11.5–15.5)
RDW: 13.7 % (ref 11.5–15.5)
WBC: 13.5 10*3/uL — ABNORMAL HIGH (ref 4.0–10.5)
WBC: 13.8 10*3/uL — ABNORMAL HIGH (ref 4.0–10.5)
WBC: 9.5 10*3/uL (ref 4.0–10.5)
WBC: 9.7 10*3/uL (ref 4.0–10.5)

## 2010-12-29 LAB — APTT
aPTT: 100 seconds — ABNORMAL HIGH (ref 24–37)
aPTT: 74 seconds — ABNORMAL HIGH (ref 24–37)
aPTT: 74 seconds — ABNORMAL HIGH (ref 24–37)
aPTT: 77 seconds — ABNORMAL HIGH (ref 24–37)
aPTT: 80 seconds — ABNORMAL HIGH (ref 24–37)
aPTT: 86 seconds — ABNORMAL HIGH (ref 24–37)
aPTT: 90 seconds — ABNORMAL HIGH (ref 24–37)

## 2010-12-29 LAB — HEPARIN LEVEL (UNFRACTIONATED)
Heparin Unfractionated: <0.1 IU/mL — ABNORMAL LOW (ref 0.30–0.70)
Heparin Unfractionated: <0.1 IU/mL — ABNORMAL LOW (ref 0.30–0.70)
Heparin Unfractionated: <0.1 IU/mL — ABNORMAL LOW (ref 0.30–0.70)
Heparin Unfractionated: <0.1 IU/mL — ABNORMAL LOW (ref 0.30–0.70)

## 2010-12-29 LAB — PROTIME-INR
INR: 1.35 (ref 0.00–1.49)
Prothrombin Time: 16.9 seconds — ABNORMAL HIGH (ref 11.6–15.2)
Prothrombin Time: 35 seconds — ABNORMAL HIGH (ref 11.6–15.2)

## 2010-12-29 LAB — LUPUS ANTICOAGULANT PANEL
DRVVT: 44.9 secs — ABNORMAL HIGH (ref 36.2–44.3)
PTTLA 4:1 Mix: 47.5 secs — ABNORMAL HIGH (ref 30.0–45.6)
PTTLA Confirmation: 23 secs — ABNORMAL HIGH (ref <8.0)
dRVVT Incubated 1:1 Mix: 43 secs (ref 36.2–44.3)

## 2010-12-29 LAB — CARDIOLIPIN ANTIBODIES, IGG, IGM, IGA
Anticardiolipin IgA: 2 APL U/mL — ABNORMAL LOW (ref <22)
Anticardiolipin IgG: 3 GPL U/mL — ABNORMAL LOW (ref <23)

## 2010-12-29 LAB — PROTEIN C ACTIVITY: Protein C Activity: 86 % (ref 75–133)

## 2010-12-30 LAB — COMPREHENSIVE METABOLIC PANEL
Albumin: 4.2 g/dL (ref 3.5–5.2)
BUN: 6 mg/dL (ref 6–23)
Calcium: 10.2 mg/dL (ref 8.4–10.5)
Creatinine, Ser: 0.84 mg/dL (ref 0.4–1.5)
Total Bilirubin: 0.6 mg/dL (ref 0.3–1.2)
Total Protein: 7.1 g/dL (ref 6.0–8.3)

## 2010-12-30 LAB — SURGICAL PCR SCREEN
MRSA, PCR: NEGATIVE
Staphylococcus aureus: NEGATIVE

## 2010-12-30 LAB — CBC
MCH: 31.7 pg (ref 26.0–34.0)
MCHC: 34.2 g/dL (ref 30.0–36.0)
MCV: 92.7 fL (ref 78.0–100.0)
Platelets: 312 10*3/uL (ref 150–400)
RDW: 12.8 % (ref 11.5–15.5)

## 2010-12-30 LAB — TYPE AND SCREEN: Antibody Screen: NEGATIVE

## 2010-12-30 LAB — PROTIME-INR: INR: 0.93 (ref 0.00–1.49)

## 2010-12-30 LAB — POCT I-STAT 4, (NA,K, GLUC, HGB,HCT)
HCT: 35 % — ABNORMAL LOW (ref 39.0–52.0)
Hemoglobin: 11.9 g/dL — ABNORMAL LOW (ref 13.0–17.0)
Potassium: 4 mEq/L (ref 3.5–5.1)
Potassium: 4.4 mEq/L (ref 3.5–5.1)
Sodium: 137 mEq/L (ref 135–145)

## 2010-12-30 LAB — URINALYSIS, ROUTINE W REFLEX MICROSCOPIC
Bilirubin Urine: NEGATIVE
Nitrite: NEGATIVE
Specific Gravity, Urine: 1.011 (ref 1.005–1.030)
Urobilinogen, UA: 0.2 mg/dL (ref 0.0–1.0)

## 2010-12-30 LAB — BASIC METABOLIC PANEL
CO2: 27 mEq/L (ref 19–32)
Calcium: 9.3 mg/dL (ref 8.4–10.5)
Creatinine, Ser: 0.76 mg/dL (ref 0.4–1.5)
GFR calc Af Amer: >60 mL/min (ref >60)

## 2010-12-30 LAB — POCT I-STAT, CHEM 8
Calcium, Ion: 1.17 mmol/L (ref 1.12–1.32)
Chloride: 105 mEq/L (ref 96–112)
HCT: 45 % (ref 39.0–52.0)
Hemoglobin: 15.3 g/dL (ref 13.0–17.0)
TCO2: 25 mmol/L (ref 0–100)

## 2010-12-30 LAB — APTT: aPTT: 31 seconds (ref 24–37)

## 2011-01-08 ENCOUNTER — Ambulatory Visit (INDEPENDENT_AMBULATORY_CARE_PROVIDER_SITE_OTHER): Payer: BC Managed Care – PPO | Admitting: Vascular Surgery

## 2011-01-08 DIAGNOSIS — I70219 Atherosclerosis of native arteries of extremities with intermittent claudication, unspecified extremity: Secondary | ICD-10-CM

## 2011-01-11 NOTE — Assessment & Plan Note (Signed)
OFFICE VISIT  Dennis, WHILDEN Webster DOB:  January 04, 1946                                       01/08/2011 ZOXWR#:[id number]  HISTORY OF PRESENT ILLNESS:  This gentleman is well-known to me from multiple previous interventions.  Previously I did a right to left femoral-femoral bypass with Propaten and then a vein bypass from the bypass graft down to the above knee popliteal artery.  This unfortunately resulted in the above knee segment thrombosing and the patient developing acute ischemia in his foot so I then later on the same night took him back for a revision, a thrombectomy on the fem-fem bypass and then a new bypass with Propaten down to the below knee segment.  He actually healed up well from that, was doing fine when he had an episode where he possibly embolized from possibly the aorta.  He had decreased ABIs in the right leg and at the same time thrombosed off his bypass from the femoral artery to the below knee pop.  He was still doing fine when he thrombosed off his femoral-femoral bypass.  At this time a workup was completed for his thrombophilia and he was found to have both a lupus anticoagulant and also HITT.  So it was felt as he was now claudicating significantly as previously and previously in fact had rest pain in the left leg that some type of intervention would be necessary to keep him from losing the left leg.  So we took him back to the operating room on January 10.  We did an iliofemoral endarterectomy on the right side and then redid the right to left femoral-femoral bypass with Dacron.  He recovered for this initially without any problems.  Unfortunately then he started developing erythema at the right groin and swelling.  Based on these findings there was concern of possible infection of the femoral-femoral bypass.  He was admitted to the hospital and underwent multiple IV antibiotics guided by infectious disease.  At this point the patient  has completed a greater than 6 week course of antibiotics that presumably sterilized a possible graft infection.  Note that within 2 or 3 days of starting antibiotics actually the fluid collection in the right groin had completely resolved.  At this point he denies any fevers or chills.  Recently he was admitted to [facility] for some respiratory symptoms.  They were uncertain exactly why he had these symptoms and there was (per the patient) no evidence of frank pneumonia.  At this point he notes the swelling in both groins is completely resolved.  He is having improvement in his claudication symptoms but he is still having some claudication symptoms.  PHYSICAL EXAMINATION:  Today he has a blood pressure 120/69, a heart rate 86, respirations were 20, satting 93% on room air.  On focused exam bilateral groins are well-healed at this point.  There is no fluid collection.  There is a strong femoral-femoral bypass pulse.  Both feet are warm without obvious palpable pulses, however.  MEDICAL DECISION MAKING:  This is a 65 year old gentleman with extensive history that I have detailed above.  I recently received a note from his vascular surgeon at [facility].  The patient lives in [place] and would like to transfer his care to [facility].  I had previously had an extensive discussion with the wife in regards to his  current condition and their preference to transfer to [facility] for continued care.  At this point I have offered to speak with his vascular surgeon as needed and all of our records will be available along with images as needed to continue this patient's care.  We appreciate being given the opportunity to participate in this patient's care and we are available as needed.    [name], MD Electronically Signed  BLC/MEDQ  D:  01/08/2011  T:  01/11/2011  Job:  [id number]  cc:   Vesta Mixer, M.D.

## 2011-02-20 IMAGING — CR DG CHEST 2V
2 series · 2 of 2 positions shown · non-contrast
Comparison: 08/14/2010.

CLINICAL DATA: 64-year-old male preoperative study for
thrombectomy.

CHEST - 2 VIEW

[view not recorded (1 of 2)]
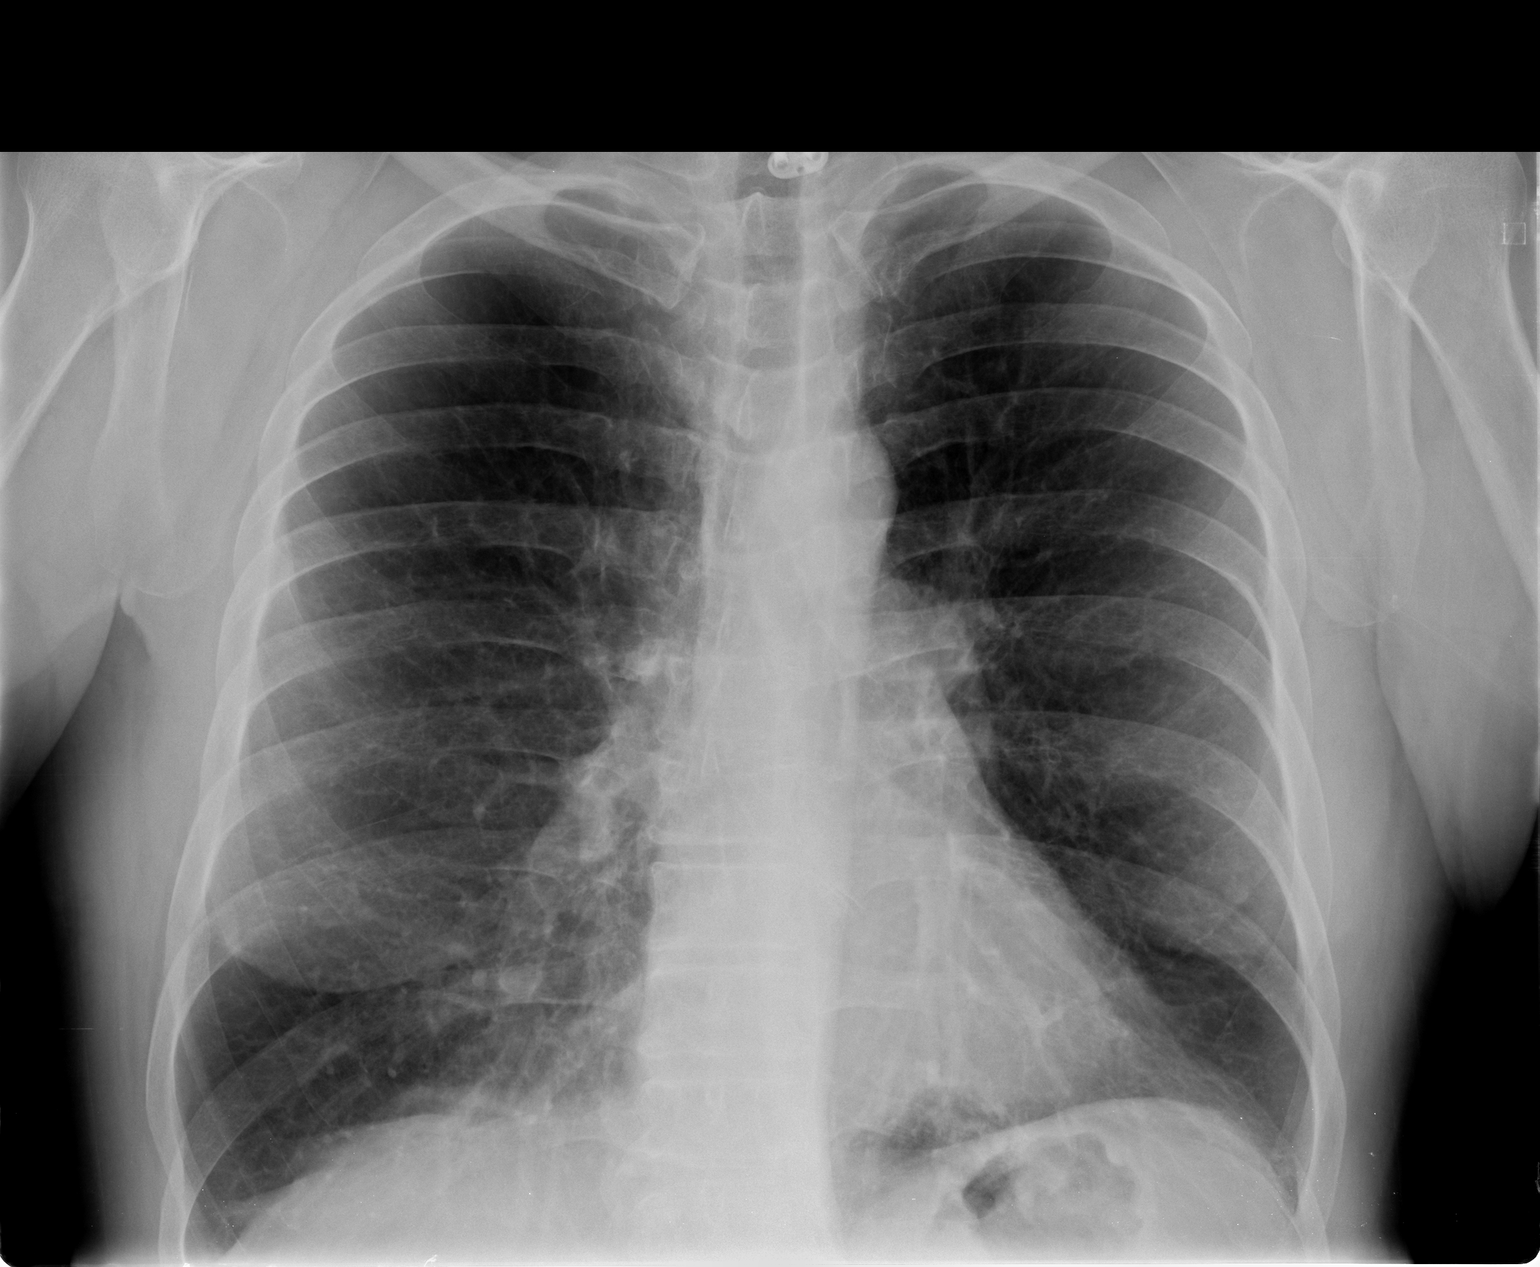

[view not recorded (2 of 2)]
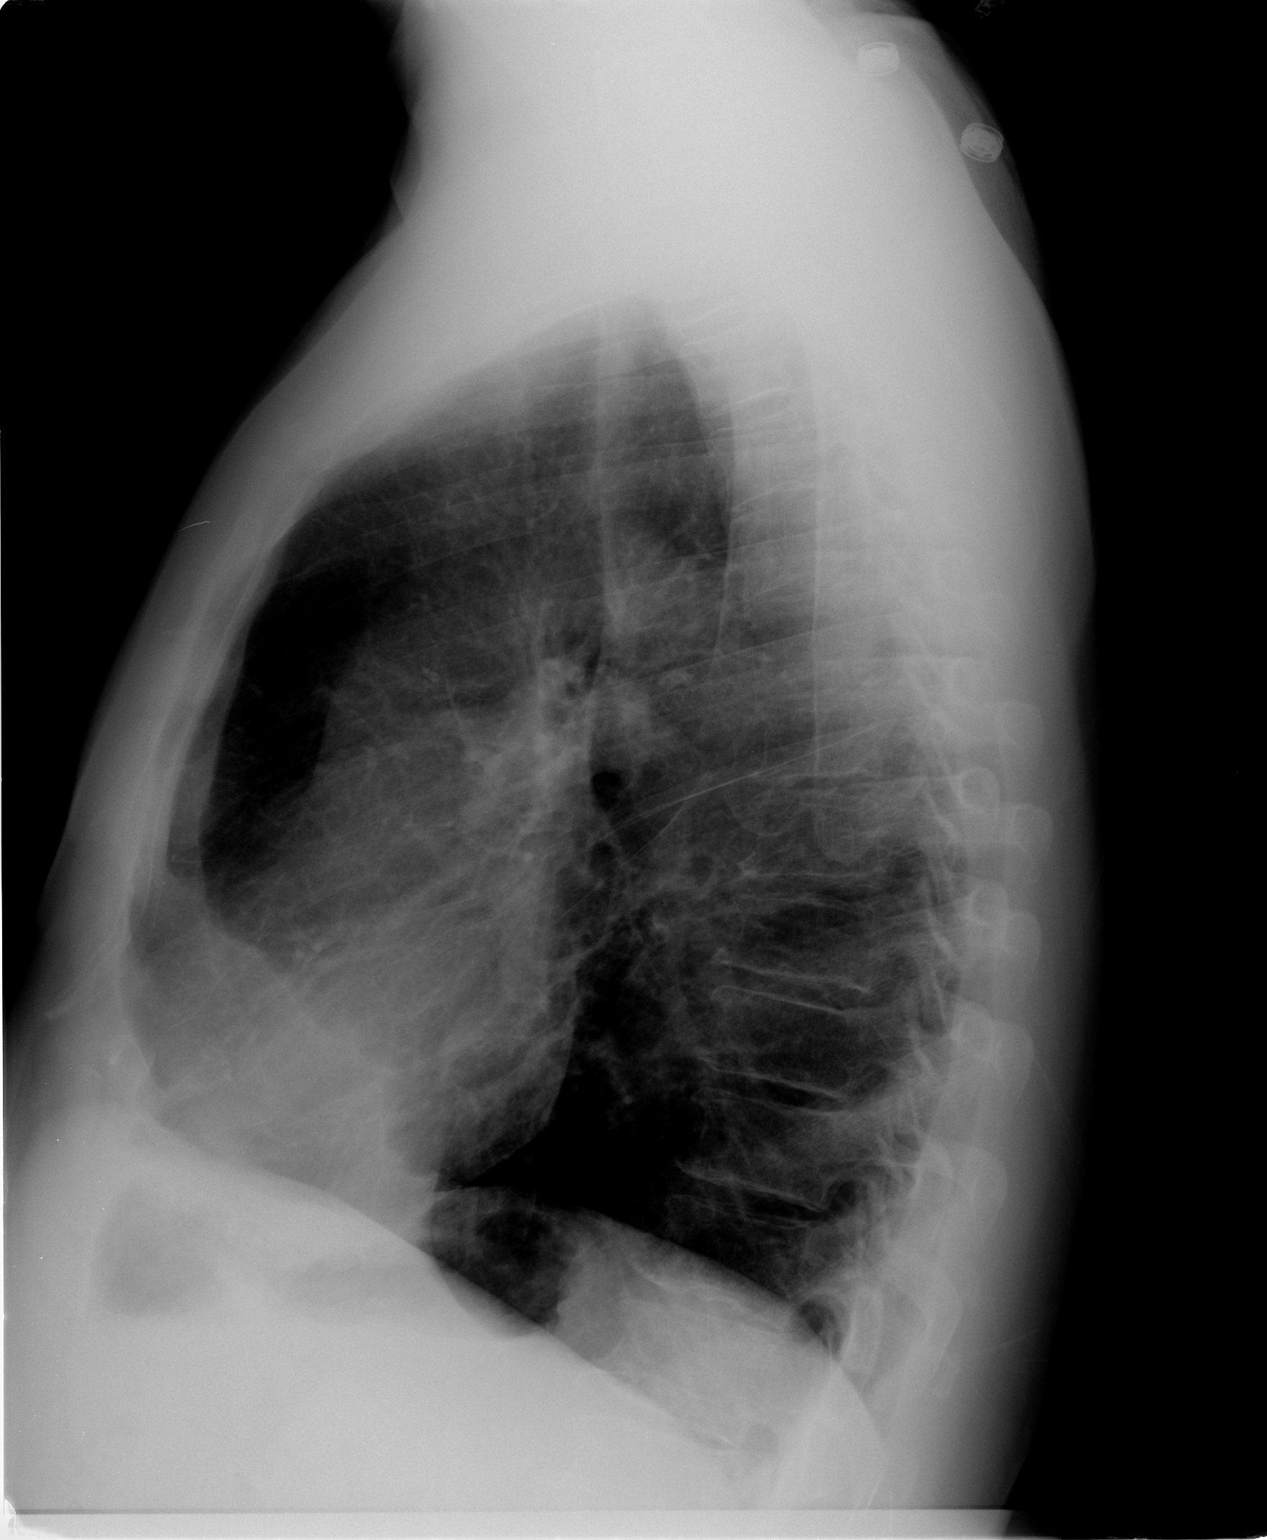

[2 of 2 positions shown; findings below may reference images not displayed]

FINDINGS: Stable lung volumes.  Cardiac size and mediastinal
contours are within normal limits.  Cervical ACDF hardware again
noted.  Diffuse increased interstitial markings are stable.  No
pneumothorax, pulmonary edema, pleural effusion or acute airspace
disease. No acute osseous abnormality identified.
IMPRESSION: Stable interstitial lung changes. No acute cardiopulmonary
abnormality.

## 2011-03-02 NOTE — Procedures (Signed)
LOWER EXTREMITY ARTERIAL DUPLEX   INDICATION:  History of PAD.   HISTORY:  Diabetes:  No.  Cardiac:  MI, CHF.  Hypertension:  Yes.  Smoking:  Yes.  Previous Surgery:  No.   SINGLE LEVEL ARTERIAL EXAM                          RIGHT                LEFT  Brachial:               162                  151  Anterior tibial:        156                  42  Posterior tibial:       151                  52  Peroneal:  Ankle/Brachial Index:   0.96                 0.32   LOWER EXTREMITY ARTERIAL DUPLEX EXAM   DUPLEX:  1. Monophasic Doppler waveforms noted in the left common femoral,      proximal profunda femoral, popliteal, and tibial arteries.  2. No flow was detected throughout the left superficial femoral      artery.   IMPRESSION:  1. The left superficial femoral artery appears to be totally occluded      based on color flow and Doppler imaging.  2. Severely decreased left ankle brachial index with a normal right      ankle brachial index noted.        ___________________________________________  Leonides Sake, MD   CH/MEDQ  D:  07/24/2010  T:  07/24/2010  Job:  [id number]

## 2011-03-02 NOTE — Assessment & Plan Note (Signed)
OFFICE VISIT   OM, LIZOTTE  DOB:  June 24, 1946                                       07/24/2010  EAVWU#:[id number]   ADDENDUM:   He additionally had a bilateral lower extremity vein mapping completed.  On the right side, the saphenous vein is inadequate for bypass purposes.  On the left side, he has vein that ranges from 0.31 cm up to as low as  0.23 in the mid calf region.  This may be a marginal vein.  On the left  side, additionally, there is some small saphenous vein which is ranging  from 0.39 cm to 0.24.  The right small saphenous vein is also noted to  be inadequate.     Leonides Sake, MD  Electronically Signed   BC/MEDQ  D:  07/24/2010  T:  07/27/2010  Job:  [id number]

## 2011-03-02 NOTE — Assessment & Plan Note (Signed)
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  He has now completed his preop  evaluation.  By report his stress test was negative and he has been  cleared for surgery.  The official documentation will be obtained today.  This meeting primarily was for counseling purposes.  We spent over 30  minutes discussing the operation which we are proposing: a left common  femoral artery endarterectomy with patch angioplasty and a right to left  femoral-femoral bypass with Propaten graft  and then a bypass from his  right common femoral artery down to his either above-knee popliteal  segment or his below knee popliteal segment.  This will be determined  based on the length of vein that is available.  His above the knee  popliteal artery on the imaging appears to be relatively undiseased.  However, these images were of limited quality as he has a total  occlusion on the left inflow system.  I likely would explore the left  saphenous vein and then make a determination based on the length of  available saphenous vein which target I am going to end up using.  Unfortunately his right leg the greater saphenous vein is of inadequate  caliber.  We had a long extensive discussion in terms of his risks,  benefits and potential alternatives, specifically the risks we discussed  included but were not limited to death, MI, stroke, infection, bleeding  infected graft, need for additional procedures, limited patency of any  type of graft reconstruction.  We had this extensive discussion.  I once  again emphasized the importance of smoking cessation and spent almost 5  minutes reiterating the problems with him continuing to smoke and he is  in the process of stopping his smoking.  All questions were answered  between the patient and his wife and we will tentatively schedule him  for the 31st of October for the previously noted procedure.     Leonides Sake, MD  Electronically Signed   BC/MEDQ  D:  08/07/2010  T:  08/07/2010  Job:  [id number]

## 2011-03-02 NOTE — Assessment & Plan Note (Signed)
OFFICE VISIT   JASIM, HARARI  DOB:  1946/06/15                                       08/28/2010  ZOXWR#:[id number]   This is a postop followup.   HISTORY OF PRESENT ILLNESS:  This is a 65 year old gentleman who  recently was discharged from the hospital.  He had a complicated  procedure, a right to left fem-fem bypass and initially a vein bypass  from the graft down to his above knee popliteal artery.  Less than 2  hours later after the procedure he clotted off his vein bypass and  became ischemic.  I ended up having to do thrombectomy on his tibials  and popliteal artery and then did a thrombectomy on the fem-fem and then  jumped from the graft down to below knee pop graft.  He was found to be  hypercoagulable with possibly a lupus anticoagulant.  Currently on  Coumadin.  He was doing fine until about 2 days ago he started  developing some coolness in the right foot and was told to come in for  evaluation.  However, for some reason was not able to come in yesterday  for evaluation so he presents today.  At this point the foot feels  better intermittently, some coolness versus some warmth.  He does not  note any neurologic signs on either side and does not note any new pain  from previously.   PHYSICAL EXAMINATION:  Vital signs:  His blood pressure 115/62, heart  rate of 99, respirations 12, saturation 94% actually on room air even  though he got sent home on home oxygen.  On examination focused exam:  He has palpable right femoral pulses.  The incision here has healed  well.  The incision in the left groin also has healed well.  His above  the knee popliteal exposure has healed well as has his calf incision  healed.  His previous medial vein harvest site had been draining but at  this point this appears to be watertight without any drainage further.  His left foot is warm but not as warm as previous.  There are no  palpable pulses per se in this foot.   On his right side also he does  have a palpable posterior tibial and dorsalis pedis.  However, there is  marked change in temperature from the knee to his lower calf.   MEDICAL DECISION MAKING:  This is a 65 year old male status post  complicated left femoral to right femoral bypass with a bypass then for  a common femoral graft down to the below the knee popliteal artery.  The  patient is fully anticoagulated.  I am not exactly certain why with a  palpable right common femoral pulse he would then go on to develop  problems of the right leg.  I ordered on this gentleman bilateral lower  extremity ABIs which demonstrated on the right side 0.66 which is lower  than his previous normal ABIs.  On his left side additionally he has a  decrease in the ABI from postop of 0.5 down to 0.41.  To further  interrogate what is going on with this patient I am electing to send him  to get a CTA of his abdomen and legs, to do aortogram with bilateral leg  runoff to try to identify the possible problem.  I  do not think with his  recent surgical history would be a good candidate for aortogram.  Additionally he is fully anticoagulated so I do not think that it would  be advisable to reverse him completely to do the diagnostic procedure.  Rather I would proceed with the CTA first.  At this point he does not  demonstrate any threatened limbs and I do not think immediate  intervention is absolutely necessary.     Leonides Sake, MD  Electronically Signed   BC/MEDQ  D:  08/28/2010  T:  08/31/2010  Job:  [id number]

## 2011-03-02 NOTE — Assessment & Plan Note (Signed)
OFFICE VISIT   [name], [name]  DOB:  Dec 12, 1945                                       09/04/2010  WUXLK#:[id number]   This is a postop followup.   HISTORY:  This is a patient well-known to me from his right to left fem-  fem with a left common femoral artery to below knee pop bypass.  Last  week when I saw him on 11/11 when he had a feeling of coolness in the  right leg and also decreased ABIs bilaterally.  Today he returns after  obtaining an aortogram with bilateral leg runoff via CTA.  Also his  hematologist has been adjusting his Coumadin and he is now fully  therapeutic.  He notes at this point resolution of his right foot  coolness and greatly improved symptoms in his left side.   PHYSICAL EXAMINATION:  Today he had a temperature 98.1, a blood pressure  141/78, a heart rate of 85, respirations of 12.  All the wounds are well-  healed except for the inferior portion of the left groin where there is  a small area of skin breakdown and some erythema here.  There is no  tenderness to light touch.   He also had repeat ABIs.  In the right side he had ABI of 0.93, on the  left was 0.56.  This is greatly improved on both sides from previously.  I reviewed also myself the outside aortogram with bilateral leg runoff.  There was no official reading present.  The aorta is widely patent but  there is area in the mid aorta with thrombus present that is less than a  third of the circumference.  Additionally, looking at the right leg all  of the iliofemoral arteries are widely open.  The fem-fem graft is  widely open.  The right popliteal and trifurcation with tibials are  widely open.  On the left side the left common iliac artery and external  iliac artery are completely occluded.  The left common femoral artery is  widely open.  The fem-fem is noted to be plugging into filling contrast  into his groin.  The profunda is wide open and continues in continuity  down to the popliteal artery and looks to be at least a posterior  tibial, anterior tibial runoff to the foot.  The previously patent left  common femoral artery to popliteal artery bypass appears to have  occluded with no contrast within this bypass graft.   MEDICAL DECISION MAKING:  This is a 65 year old gentleman who is now  status post a right to left fem-fem graft bypass and then a left common  femoral to below the knee popliteal bypass with graft.  The fem-fem is  wide open.  His right leg is now restored to normal level of perfusion.  His left common femoral artery to below knee pop bypass unfortunately  has occluded.  My best explanation for this sequence of events is  possible embolization from an aortic thrombus source.  On the right side  he was able to recanalize the artery despite the embolus likely due to  his now therapeutic anticoagulation.  However, on the left side I  suspect the embolus occluded his graft and he was not able to recanalize  this.  At this point however he has improved blood flow through the fem-  fem and the profundoplasty that was completed as part of his procedure.  Per the patient he has greatly improved symptomatology in his left leg.  My plan is to put him on 10 days of Keflex to help control the  superficial wound infection in the left groin as he has graft in his  groin and I want to reduce any chance of infection of his groin.  He is  going to follow up with me in 2 weeks.     Leonides Sake, MD  Electronically Signed   BC/MEDQ  D:  09/04/2010  T:  09/07/2010  Job:  [id number]

## 2011-03-02 NOTE — Assessment & Plan Note (Signed)
OFFICE VISIT   [name], [name]  DOB:  1946-03-31                                       09/18/2010  JYNWG#:[id number]   This is a postop visit.   HISTORY OF PRESENT ILLNESS:  The patient is following up from a  complicated right to left fem-fem, a left common femoral to below knee  pop bypass with graft.  This patient is following up for possible  cellulitis in the left groin.  At this point his left groin is  completely healed.  No fever or chills.  He has resumed work at this  point.  He is able to ambulate without any difficulties.  Pain is much  better than prior to the intervention.   PHYSICAL EXAMINATION:  He had a temperature 97.4, blood pressure 125/77,  heart rate of 91, respirations of 12.  The left groin incision has  healed up at this point.  All the rest of the left leg incisions are  well-healed at this point.  The left foot is warm without any palpable  pulse.  The swelling in the left leg is greatly improved from previous.   MEDICAL DECISION MAKING:  This is a 65 year old gentleman status post a  right to left fem-fem bypass then a left common femoral artery to below  knee popliteal bypass with Propaten with a redo of a failed left common  femoral artery to above knee popliteal bypass with vein.  This was  complicated by what I believe thrombophilia.  A few weeks ago in fact he  appeared to have embolized from a possible aortic source and embolized  to his right leg and also to the left graft.  The right leg eventually  recanalized and he resumed normal blood flow to his leg.  However, the  graft failed to recanalize.  However, he continues to have in-line flow  via his profunda system to his left leg and his symptomatology which was  previously rest pain has resolved at this point.  My plan at this point  is allow this gentleman to heal up completely, and in 3 months will  repeat his ABIs and duplex studies to verify patency.  If at that  point  everything is well we will stretch out to 6 months.  The patient was  given instructions in regards to what to follow up for and he will  follow up with [place] as needed and at the 3 month mark.     Leonides Sake, MD  Electronically Signed   BC/MEDQ  D:  09/18/2010  T:  09/18/2010  Job:  [id number]

## 2011-03-02 NOTE — Assessment & Plan Note (Signed)
OFFICE VISIT   JOVANNI, RASH  DOB:  Jul 03, 1946                                       08/27/2010  EAVWU#:[id number]   The patient is a patient of Dr. Nicky Pugh.  He apparently was complaining  of some coolness in his right leg after recent bypass.  It was discussed  with his wife that he could come in for an office visit today.  However,  she stated that she had other things to do and could not bring him by  today.  We emphasized to her that if his leg is getting colder he should  at least come by the office or to the emergency room if she thinks his  symptoms are progressive.  If not she will schedule him for followup  with Dr. Imogene Burn tomorrow.     Janetta Hora. Fields, MD  Electronically Signed   CEF/MEDQ  D:  08/27/2010  T:  08/28/2010  Job:  [phone]

## 2011-03-02 NOTE — Assessment & Plan Note (Signed)
OFFICE VISIT   GOLDMAN, BIRCHALL  DOB:  [birth year]                                       10/23/2010  ZOXWR#:[id number]   The patient is a 65 year old Caucasian male who presents for urgent  evaluation secondary to complaints of increasing claudication symptoms.  The patient is status post right to left femoral-femoral bypass graft  with Gore-Tex as well as left common femoral artery to above the knee  popliteal bypass with vein conduit on 08/17/2010.  Approximately 2 hours  after completion of the aforementioned procedure the patient was noted  to have clotted and was therefore taken back to the OR for a  thrombectomy of the right to left fem-fem bypass, thrombectomy of the  left popliteal artery and posterior tibial artery as well as anterior  tibial artery, and left common femoral artery to below the knee  popliteal bypass with Gore-Tex graft.  The patient has been evaluated in  followup by Dr. Imogene Burn and on 09/04/2010 he was noted to have a patent  right to left fem-fem graft with an occluded fem-pop bypass.  The  patient was asymptomatic at that time and his ABIs had still improved.   The patient states that beginning 2 days ago he noted left lower  extremity discomfort which has progressed to short distance claudication  which resolves with several minutes of rest.  He denies rest pain.  He  denies pain and claudication of the right lower extremity.  He has had  some coolness in the left foot but denies frank numbness.  The patient  has been taking Coumadin as he was found to be hypercoagulable while in  the hospital post procedures previously.  He states that he was last  evaluated by hematology/oncology on 10/14/2010 with an INR of 2.1.  He  has been taking 2 mg of Coumadin daily.  Lower extremity bypass and  arterial duplex was completed on 10/23/2010.  This revealed a known  occlusion of the left common femoral to popliteal artery bypass graft.  The  fem-fem graft is now occluded as well.  There are patent external  iliac, common femoral artery, profunda and popliteal arteries on the  left with continuous flow.  The external iliac artery  and popliteals  are being fed by collaterals.   PHYSICAL EXAMINATION:  The patient is resting comfortably although he is  walking with a slight limp.  In general he is well-nourished and in no  acute distress.  The left foot is cold to the touch and somewhat  mottled.  There is an anterior tibial signal present with Doppler.  There is a palpable 2+ right posterior tibial pulse and a warm foot on  the right.   Case was discussed with Dr. Imogene Burn who also feels that the patient will  need to present to the hospital for emergent thrombectomy of his right  to left femoral-femoral bypass graft.  Dr. Hart Rochester has been informed and  has consented to proceed with the thrombectomy today.  The patient was  then sent to the hospital for said procedure.   Pecola Leisure, [place]   [name], MD  Electronically Signed   AY/MEDQ  D:  10/23/2010  T:  10/23/2010  Job:  [id number]

## 2011-03-02 NOTE — Procedures (Signed)
BYPASS GRAFT EVALUATION   INDICATION:  Status post right to left fem-fem graft now with increased  claudication.   HISTORY:  Diabetes:  No.  Cardiac:  Yes.  Hypertension:  Yes.  Smoking:  Previous.  Previous Surgery:  Right to left fem-fem graft; left fem-pop graft.   SINGLE LEVEL ARTERIAL EXAM                               RIGHT              LEFT  Brachial:  Anterior tibial:  Posterior tibial:  Peroneal:  Ankle/brachial index:   PREVIOUS ABI:  Date:  RIGHT:  LEFT:   LOWER EXTREMITY BYPASS GRAFT DUPLEX EXAM:   DUPLEX:  Duplex with confirmed known occlusion of left CFA to popliteal  graft and SFA; left CIA and EIA previously occluded.  The fem-fem graft is occluded.  Patent EIA, CIA, PFA and popliteal on the left with continuous flow in  the popliteal.  EIA and popliteal being fed by collaterals.   IMPRESSION:  1. Occluded right to left femoral-femoral graft.  2. Results discussed with Marchelle Folks, the PA.   ___________________________________________  [name], MD   LT/MEDQ  D:  10/23/2010  T:  10/23/2010  Job:  [id number]

## 2011-03-02 NOTE — Assessment & Plan Note (Signed)
OFFICE VISIT   WYNDHAM, SANTILLI  DOB:  03-Oct-1946                                       07/24/2010  WGNFA#:[id number]   This is a new patient vascular consultation.   HISTORY OF PRESENT ILLNESS:  This is a 65 year old gentleman who  presents with a chief complaint of left leg pain at night.  Onset of  symptomatology was greater than 13 years ago.  At that point he had  undergone an aortogram which demonstrated distal SFA occlusion with  significant collateralization around the occlusion.  He did not require  angioplasty or any type of surgical intervention at that point.  The  patient has not had any symptoms per se until about 4 weeks ago when he  started developing pain with short distance walking in predominately the  left calf which now has progressed to the point that even at night time  the pain in his left calf wakes him up from sleep.  He denies any prior  history of any type of ulcers or any gangrene.  His risk factors for  atherosclerosis include hypertension, hypercholesterolemia and continued  smoking.   PAST MEDICAL HISTORY:  1. Hypertension.  2. Hyperlipidemia.  3. Peripheral arterial disease.  4. Degenerative disk disease.  5. History of multiple bilateral lower extremity fractures.   PAST SURGICAL HISTORY:  1. Previously noted aortogram with leg runoff.  2. Cervical anterior interbody fusion.  3. Multiple bilateral lower extremity open reductions and internal      fixations.   SOCIAL HISTORY:  He is married with two children, is currently a  IT consultant.  He has a greater than 40 year pack year history of smoking  and continues to smoke.  Denies alcohol or illicit drug use.   FAMILY HISTORY:  Mother had cancer and emphysema.  Father's history is  unknown.   MEDICATIONS:  Aspirin.   ALLERGIES:  No known drug allergies.   REVIEW OF SYSTEMS:  He notes weight loss, poor balance in legs, pain in  legs with walking, pain with lying  flat, wheezing, muscle pain,  depression.  Otherwise 12 point review of systems on him was noted to be  negative in the chart.   PHYSICAL EXAMINATION:  Vital signs:  Temperature 97.8, blood pressure  138/79, heart rate of 86, respirations were 12.  General:  He was alert and oriented x3, well-developed, well-nourished,  in no apparent distress.  Head:  Normocephalic, atraumatic.  No temporalis wasting.  ENT:  Hearing is grossly intact.  Nares without any erythema or  drainage.  Oropharynx without erythema or exudate.  Eyes:  Pupils were  equal, round, reactive to light.  Extraocular movements were intact.  Neck:  Supple neck with no nuchal rigidity.  No palpable  lymphadenopathy.  Pulmonary:  Symmetric expansion.  Good air movement.  Clear to auscultation bilaterally without rales, rhonchi or wheezing.  Cardiovascular:  Regular rate and rhythm.  Normal S1-S2.  No murmurs,  rubs, thrills or gallops.  Vascular:  He had palpable upper extremity  pulses and also palpable carotids with no bruits.  The aorta was not  palpable.  He had palpable right femoral pulse, popliteal, posterior  tibial and dorsal pedis on right.  On the left side I only faintly felt  what I think is the femoral pulse but I could not feel a  popliteal,  posterior tibial or dorsal pedis pulse.  GI:  Soft, nontender, nondistended.  No guarding.  No rebound or  hepatosplenomegaly.  No masses.  No costovertebral angle tenderness.  Musculoskeletal:  Motor strength was 5/5 throughout.  There are multiple  incisions on bilateral lower extremities consistent with previous ORIFs  that were noted.  Neurological:  Cranial nerves II-XII were intact, sensation grossly  intact in lower extremities.  Motor exam was as listed above.  Psychiatric:  Judgment intact.  Mood and affect appropriate for his  clinical situation.  Skin:  He has no evidence of gangrene or ulcers in either lower  extremities.  Did not appreciate any rashes  elsewhere.  Lymphs:  There was no lymphadenopathy in the cervical, axillary,  inguinal basins.   NONINVASIVE VASCULAR IMAGING:  He had bilateral lower extremity arterial  duplexes completed.  Right side he has ABI of 0.96 and the left 0.32.  On the duplex portion of this study he had monophasic waveforms noted in  the left common femoral, profunda, femoralis, popliteal and tibial  arteries.  There was no flow noted in the left superficial femoral  artery and appeared to be occluded throughout the whole length of the  SFA.   MEDICAL DECISION MAKING:  This is a 65 year old gentleman who has a  known history of peripheral arterial disease by history, a 13 year  history of occlusion of his distal SFA which now appears to be involving  the entirety of this SFA segment.  Given that he now has rest pain I  discussed with him the natural history of rest pain including a quarter  of patients going on to require amputation.  Additionally a third of  these patients die due to cardiac morbidity so he is going to need a  multi front approach.   1. He needs to be scheduled for aortogram and bilateral leg runoff      which is going to be scheduled for 07/30/2010.  Based on those      findings it will help me decide what surgery to offer him.  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   1. He needs bilateral lower extremity vein mapping done to see if I      have a conduit for bypass.   1. He needs cardiac workup for risk factor stratification and      optimization.  After all three are available  I will make a final      decision on which intervention will proceed with.  I discussed this      plan with the patient and he agrees to proceed forward with such.     Leonides Sake, MD  Electronically Signed   BC/MEDQ  D:  07/24/2010  T:  07/24/2010  Job:  [id number]

## 2011-03-02 NOTE — Procedures (Signed)
VASCULAR LAB EXAM   INDICATION:  Preoperative.   HISTORY:  Diabetes:  No.  Cardiac:  MI, CHF.  Hypertension:  Yes.   EXAM:  Bilateral lower extremity GSV and SSV mapping.   IMPRESSION:  Bilateral lower extremity greater saphenous veins and short  saphenous veins appear patent.   Measurements attached.   ___________________________________________  Leonides Sake, MD   EM/MEDQ  D:  07/24/2010  T:  07/24/2010  Job:  [id number]

## 2011-03-05 NOTE — Op Note (Signed)
NAME:  [name], [name]                         ACCOUNT NO.:  [id number]   MEDICAL RECORD NO.:  [id number]                   PATIENT TYPE:  OIB   LOCATION:  3172                                 FACILITY:  [facility]   PHYSICIAN:  Danae Orleans. Venetia Maxon, M.D.               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   PROCEDURE:  Anterior cervical decompression and fusion C5-C6 and C6-C7  levels with allograft bone graft and anterior cervical plates.   SURGEON:  Danae Orleans. Venetia Maxon, M.D.   ASSISTANT:  Hilda Lias, M.D.   ANESTHESIA:  General endotracheal anesthesia.   ESTIMATED BLOOD LOSS:  Minimal.   COMPLICATIONS:  None.   DISPOSITION:  Recovery room.   INDICATIONS FOR PROCEDURE:  [name] is a 65 year old man with a  profound cervical myelopathy with significant spinal cord compression C5-C6  and C6-C7 levels.  It was elected to take him to surgery for anterior  cervical decompression and fusion.   PROCEDURE:  [name] was brought to the operating room.  Following  satisfactory uncomplicated induction of general endotracheal anesthesia and  placement of intravenous  lines, the patient was placed in a supine position  on the operating table.  His neck was placed in slight extension.  He was  placed in neutral alignment.  He was placed in 10 pounds of halter traction.  His anterior neck was then prepped and draped in the usual sterile fashion.  The area of planned incision was infiltrated with 0.25% Marcaine and 0.5%  lidocaine with 1:200,000 epinephrine.  An incision was made from  the midline  to the anterior border of the sternocleidomastoid muscle and carried sharply  through the platysmal layer.  Subplatysmal dissection was performed exposing  the anterior border of the sternocleidomastoid muscle.  Using blunt  dissection, the carotid sheath was kept lateral, trachea and esophagus  medial, and the anterior cervical spine was identified.  A bent spinal  needle was placed at what was felt to be the C5-C6 level and an  interoperative x-ray, while poorly penetrated, did demonstrate the needle at  the C5-C6 interspace.  Using electrocautery and Key elevator, the longus  colli muscles were taken down from the anterior cervical spine from the C5  to C7 levels bilaterally.  A self-retaining Shadowline retractor along with  up down retractor were placed to facilitate exposure.  The interspaces at C5-  C6 and C6-C7 were then incised with a 15 blade and disc material was removed  in a piecemeal fashion.  The disc space spreaders were placed initially  at  the C6-C7 level.  There was a significant amount of herniated disc material  both medially and laterally on the left and this was removed with resulting  decompression of the spinal cord.  Subsequently, the high speed drill and  bur were used to drill down the endplates and uncinate spurs and the  posterior longitudinal ligament was incised with the arachnoid knife and  significant disc material along with ligamentous tissue were removed along  with the uncinate spurs resulting in decompression of the spinal cord dura  and both C7 nerve roots as they extended up the neural foramen.  Hemostasis  was assured with Gelfoam soaked in thrombin and 8 mm cortical cancellous  bone graft wedge was rehydrated and inserted in the interspace after a trial  sizer was utilized.  A similar decompression was performed at the C5-C6  level.  At this level, there was, again, significant spinal cord  compression, this time from uncinate spurring  more than soft disc material.  The central cord dura and both C6 nerve roots were decompressed and a  similarly size bone graft was placed.  A 44 mm Trinica anterior cervical  plate was then affixed to the anterior cervical spine using 14 mm variable  angle self-drilling screws, two at C5, two at C6, two at C7.  All screws had  excellent purchase.  Final x-ray demonstrated hardware at the C5-C6 level  and below that it was not possible to visualize the cervical spine.  The  wound was then copiously irrigated with Bacitracin saline, there was  excellent hemostasis.  The platysmal layer was reapproximated with 3-0  Vicryl sutures and the skin edges were reapproximated with a running 4-0  Vicryl subcuticular stitch.  The wound was dressed with Dermabond.  The  patient was extubated in the operating room and taken to the recovery room  in stable and satisfactory condition having tolerated the operation well.  Counts were correct at the end of the case.                                               Danae Orleans. Venetia Maxon, M.D.    JDS/MEDQ  D:  11/15/2003  T:  11/15/2003  Job:  [id number]

## 2011-03-30 IMAGING — CR DG CHEST 1V PORT
1 series · 1 of 1 positions shown · non-contrast
Comparison: 10/23/2010

CLINICAL DATA: PICC line placement.  Atherosclerosis.
Hypertension.

PORTABLE CHEST - 1 VIEW

[AP]
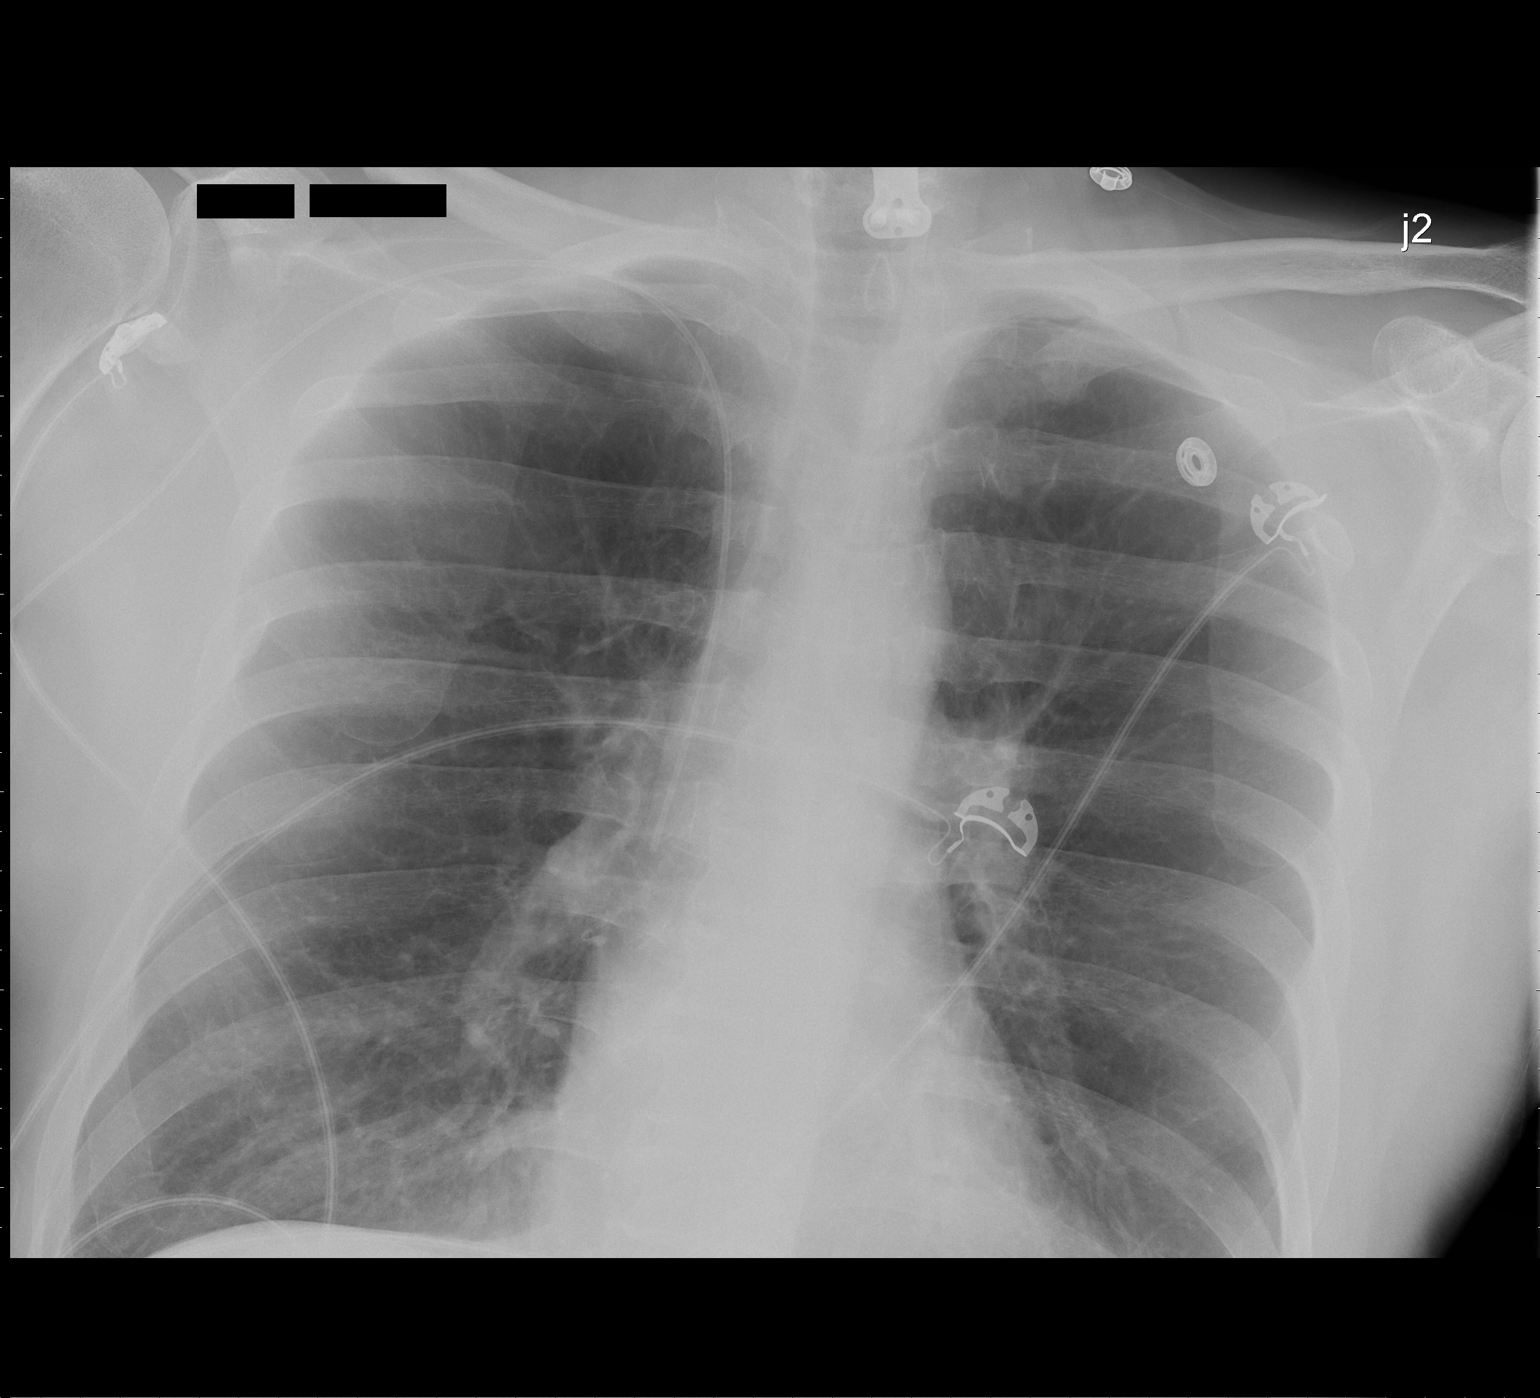

[1 of 1 positions shown; findings below may reference images not displayed]

FINDINGS: Right-sided PICC line tip projects over the superior vena
cava.  No pneumothorax or complicating feature.

Attenuated peripheral pulmonary vasculature is compatible with
emphysema/COPD.  Cardiac and mediastinal contours appear
unremarkable.
IMPRESSION: 1.  Right PICC line tip:  SVC.
2.  Emphysema.

## 2011-05-14 ENCOUNTER — Ambulatory Visit: Payer: Self-pay | Admitting: Vascular Surgery

## 2011-05-19 ENCOUNTER — Encounter: Payer: Self-pay | Admitting: Vascular Surgery

## 2011-05-20 ENCOUNTER — Encounter: Payer: Self-pay | Admitting: Vascular Surgery

## 2011-05-21 ENCOUNTER — Encounter (INDEPENDENT_AMBULATORY_CARE_PROVIDER_SITE_OTHER): Payer: BC Managed Care – PPO

## 2011-05-21 ENCOUNTER — Ambulatory Visit (INDEPENDENT_AMBULATORY_CARE_PROVIDER_SITE_OTHER): Payer: BC Managed Care – PPO | Admitting: Vascular Surgery

## 2011-05-21 ENCOUNTER — Encounter: Payer: Self-pay | Admitting: Vascular Surgery

## 2011-05-21 VITALS — BP 125/76 | HR 80 | Ht 70.0 in | Wt 177.0 lb

## 2011-05-21 VITALS — BP 118/72 | HR 70 | Resp 12

## 2011-05-21 DIAGNOSIS — I7092 Chronic total occlusion of artery of the extremities: Secondary | ICD-10-CM

## 2011-05-21 DIAGNOSIS — Z48812 Encounter for surgical aftercare following surgery on the circulatory system: Secondary | ICD-10-CM

## 2011-05-21 DIAGNOSIS — I739 Peripheral vascular disease, unspecified: Secondary | ICD-10-CM

## 2011-05-21 NOTE — Progress Notes (Signed)
[facility]  Established Intermittent Claudication  History of Present Illness  YACQUB BASTON is a 65 y.o. male who presents with chief complaint: B leg claudication.  The patient's symptoms have not progressed.  The patient's symptoms are: intermittent claudication.  The patient is able to ambulate longer than previous and is able to complete his ADL.   The patient's treatment regimen currently included: maximal medical management.  The patient notes no episodes of fever or chills.  He has had no further groin swelling.  Past Medical History, Past Surgical History, Social History, Family History, Medications, Allergies, and Review of Systems are unchanged from previous evaluation on 23 MAR 12.  Physical Examination  There were no vitals filed for this visit.  General: A&O x 3, WDWN  Pulmonary: Sym exp, good air movt, CTAB, no rales, rhonchi, & wheezing  Cardiac: RRR, Nl S1, S2, no Murmurs, rubs or gallops  Vascular: Vessel Right Left  Radial Palpable Palpable  Ulnary Palpable Palpable  Brachial Palpable Palpable  Carotid Palpable, without bruit Palpable, without bruit  Aorta Non-palpable N/A  Femoral Palpable Palpable  Popliteal Non-palpable Non-palpable  PT Non-palpable Non-palpable  DP Non-palpable Non-palpable   Musculoskeletal: M/S 5/5 throughout, Extremities without ischemic changes, Bilateral groin well healed, palpable fem-fem graft pulse  Neurologic: Pain and light touch intact in extremities, Motor exam as listed above  Non-Invasive Vascular Imaging ABI (Date: 05/21/11)  RLE: 0.57, PT and DP: monophasic  LLE: 0.57, PT and DP: monophasic  Graft Duplex (Date: 05/21/11)  Fem-fem: patent  R EIA stenosis: 561 c/s   Occluded L fem-pop bypasses  Medical Decision Making  SEAN MALINOWSKI is a 65 y.o. male who presents with: B leg intermittent claudication without evidence of critical limb ischemia.  Based on the patient's vascular  studies and examination, I have offered the patient: Ao and R iliac intervention which will be scheduled in the next 2-4 weeks.  I discussed in depth with the patient the nature of atherosclerosis, and emphasized the importance of maximal medical management including strict control of blood pressure, blood glucose, and lipid levels, obtaining regular exercise, and cessation of smoking.  The patient is aware that without maximal medical management the underlying atherosclerotic disease process will progress, limiting the benefit of any interventions.  Thank you for allowing [place] to participate in this patient's care.  Leonides Sake, MD [facility] [place] Office: [phone] Pager: [phone]

## 2011-06-01 NOTE — Procedures (Unsigned)
BYPASS GRAFT EVALUATION  INDICATION:  Followup peripheral vascular disease.  HISTORY: Diabetes:  No. Cardiac:  COPD. Hypertension:  No. Smoking:  Previous. Previous Surgery:  Right to left femoral to femoral bypass redo with right common femoral artery and proximal superficial femoral artery endarterectomy on 10/27/2010; left femoral to popliteal above knee bypass graft 08/18/2010.  SINGLE LEVEL ARTERIAL EXAM                              RIGHT              LEFT Brachial:                    137                159 Anterior tibial:             90                 90 Posterior tibial:            87                 87 Peroneal: Ankle/brachial index:        0.57               0.57  PREVIOUS ABI:  Date:  11/13/2010  RIGHT:  0.68  LEFT:  0.54  LOWER EXTREMITY BYPASS GRAFT DUPLEX EXAM:  DUPLEX:  Elevated velocities involving the right distal external iliac artery with a peak systolic velocity of 561 cm/s suggesting a greater than 75% stenosis.  IMPRESSION: 1. Patent right femoral to left femoral bypass graft.  Stenosis of the     inflow artery as noted above. 2. 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 3. Monophasic Doppler signals noted in the left popliteal artery. 4. Bilateral ankle brachial indices are in the moderate claudication     range and essentially unchanged since study on 11/13/2010.  ___________________________________________ [name], MD  SH/MEDQ  D:  05/21/2011  T:  05/21/2011  Job:  [id number]

## 2011-07-29 ENCOUNTER — Ambulatory Visit (HOSPITAL_COMMUNITY)
Admission: RE | Admit: 2011-07-29 | Discharge: 2011-07-29 | Disposition: A | Discharge status: Home / Self Care | Payer: BC Managed Care – PPO | Source: Ambulatory Visit | Attending: Vascular Surgery | Admitting: Vascular Surgery

## 2011-07-29 DIAGNOSIS — I739 Peripheral vascular disease, unspecified: Secondary | ICD-10-CM

## 2011-07-29 DIAGNOSIS — I708 Atherosclerosis of other arteries: Secondary | ICD-10-CM | POA: Insufficient documentation

## 2011-07-29 DIAGNOSIS — E785 Hyperlipidemia, unspecified: Secondary | ICD-10-CM | POA: Insufficient documentation

## 2011-07-29 DIAGNOSIS — D75829 Heparin-induced thrombocytopenia, unspecified: Secondary | ICD-10-CM | POA: Insufficient documentation

## 2011-07-29 DIAGNOSIS — Z01810 Encounter for preprocedural cardiovascular examination: Secondary | ICD-10-CM | POA: Insufficient documentation

## 2011-07-29 DIAGNOSIS — D7582 Heparin induced thrombocytopenia (HIT): Secondary | ICD-10-CM | POA: Insufficient documentation

## 2011-07-29 DIAGNOSIS — IMO0002 Reserved for concepts with insufficient information to code with codable children: Secondary | ICD-10-CM | POA: Insufficient documentation

## 2011-07-29 DIAGNOSIS — I70219 Atherosclerosis of native arteries of extremities with intermittent claudication, unspecified extremity: Secondary | ICD-10-CM | POA: Insufficient documentation

## 2011-07-29 DIAGNOSIS — I10 Essential (primary) hypertension: Secondary | ICD-10-CM | POA: Insufficient documentation

## 2011-07-29 LAB — POCT I-STAT, CHEM 8
Calcium, Ion: 1.29 mmol/L (ref 1.12–1.32)
Glucose, Bld: 99 mg/dL (ref 70–99)
HCT: 47 % (ref 39.0–52.0)
Hemoglobin: 16 g/dL (ref 13.0–17.0)

## 2011-07-29 LAB — PROTIME-INR: Prothrombin Time: 14.5 seconds (ref 11.6–15.2)

## 2011-08-04 NOTE — Op Note (Signed)
NAMEMARQUELLE, [name] NO.:  [id number]  MEDICAL RECORD NO.:  [id number]  LOCATION:  [facility]                         FACILITY:  [facility]  PHYSICIAN:  Dennis Hertz, MD       DATE OF BIRTH:  1946-09-18  DATE OF PROCEDURE:  07/29/2011 DATE OF DISCHARGE:  07/29/2011                              OPERATIVE REPORT   PROCEDURE: 1. Right common femoral artery cannulation under ultrasound guidance. 2. Aortogram.  PREOPERATIVE DIAGNOSIS:  Bilateral lower extremity claudication.  POSTOP DIAGNOSIS:  Bilateral lower extremity claudication.  SURGEON:  Dennis Hertz, MD  ANESTHESIA:  Conscious sedation.  ESTIMATED BLOOD LOSS:  Minimal.  CONTRAST:  40 mL.  SPECIMENS:  None.  FINDINGS: 1. Patent aorta. 2. Patent celiac and SMA. 3. Patent bilateral renal arteries with bilateral nephrograms evident. 4. Occluded left common iliac artery. 5. Patent right common iliac artery. 6. Bilateral internal iliac arteries are occluded. 7. Right external iliac artery stenosis about 4 mm at its tightest. 8. There is a right common femoral artery stenosis, at its tightest     about 2 mm. 9. There is a widely patent femoral-femoral bypass  10. Bilateral profunda arteries are open. 11. Bilateral superficial femoral arteries are occluded bilaterally.  INDICATIONS:  This is a 65 year old gentleman with a complicated revascularization history.  Previously he has undergone a right to left femoral-femoral bypass and then a left femoral to popliteal bypass x2. 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  He eventually thrombosed his femoral-femoral bypass also and required a redo procedure.  During that procedure, there was an area of the common femoral artery that was quite fragile and required a patch angioplasty to repair it.  The femoral-femoral bypass  was sewn to  this portion of the patched artery.  The patient has been  relatively symptom-free.  On surveillance imaging there was a suggestion  of increased velocity in the right external iliac system.  Subsequently,  I felt that diagnostic angiogram and possible intervention was going to be necessary.  The patient is aware of the risks of this procedure include bleeding, infection, possible thrombosis of his bypass graft, possible rupture of any treated vessels, possible dissection, possible embolization, and possible renal failure.  He is aware of these risks  and agreed to proceed forward.  OPERATION: After full informed written consent was obtained from the  patient, he was brought back to the angio suite, placed supine upon  angio table.  He was connected to monitoring equipment and then given  conscious sedation, amounts of which were documented in the chart.  He  was then turned prepped and draped in the standard fashion for an aortogram  and bilateral leg runoff.  I turned my attention to the right groin. Under ultrasound guidance, I identified a widely patent femoral-femoral  bypass, and then also identified the common femoral artery.  I was able  to cannulate this with a 18-gauge needle and passed the Bentson wire up  into the aorta and then passed an end hole catheter over this wire up  into the aorta.  The wire was exchanged out for Amplatz wire.  I then  placed a 5-French sheath with great difficulty through the scar tissue  up into the common femoral artery.  The dilator was then removed.  The  Omni flush catheter was then loaded up to the level of L1.  The catheter  was connected to the power injector circuit after performing a declotting,  de-airing  maneuver.  The power injector aortogram findings are as listed  above.  I then pulled down the catheter to just proximal to the aortic  bifurcation and did a pelvic injection which demonstrated the findings  listed above.  Based on this  injection there is no way from a  endovascular viewpoint to intervene safely on this proximal common  femoral artery stenosis.  My concern is that any angioplasty balloon  or stent makes split this portion of the artery and this maybe in fact  the portion of the artery that required patch angioplasty.  Subsequently,  I do not think that an endovascular intervention is safe in this case.   Based on those findings, this patient in fact needs an aortobifemoral  bypass which previously we had avoided due to his comorbidities at that point, I decided to abort any further procedure.  The sheath was dis-  connected from the power injector circuit.  The sheath was aspirated  there was no  clot.  The plan is to pull the sheath in the holding area.  COMPLICATIONS:  None.  CONDITION:  Stable.     Dennis Hertz, MD     BLC/MEDQ  D:  07/29/2011  T:  07/30/2011  Job:  [id number]  Electronically Signed by Leonides Sake MD on 08/04/2011 09:31:03 PM

## 2011-08-20 ENCOUNTER — Encounter: Payer: Self-pay | Admitting: Vascular Surgery

## 2011-08-26 ENCOUNTER — Encounter: Payer: Self-pay | Admitting: Vascular Surgery

## 2011-08-27 ENCOUNTER — Ambulatory Visit (INDEPENDENT_AMBULATORY_CARE_PROVIDER_SITE_OTHER): Payer: BC Managed Care – PPO | Admitting: Vascular Surgery

## 2011-08-27 ENCOUNTER — Encounter: Payer: Self-pay | Admitting: Vascular Surgery

## 2011-08-27 VITALS — BP 126/74 | HR 83 | Ht 70.0 in | Wt 179.0 lb

## 2011-08-27 DIAGNOSIS — I70219 Atherosclerosis of native arteries of extremities with intermittent claudication, unspecified extremity: Secondary | ICD-10-CM

## 2011-08-27 NOTE — Progress Notes (Signed)
[facility] [place]  Postoperative Visit  History of Present Illness  [name] is a 65 y.o. male who presents for postoperative follow-up for: Dx Aortogram and pelvic runoff (Date: 07/29/11).  The patient's wounds are healed.  The patient continues to have some bilateral claudication symtpoms.  The patient is able to complete their activities of daily living.  The patient's current symptoms are: continued bilateral foot numbness present prior to any vascular intervention and intermittent claudication in both legs.  Physical Examination  Filed Vitals:   08/27/11 1343  BP: 126/74  Pulse: 83    General: A&O x 3, WDWN  Pulmonary: Sym exp, good air movt, CTAB, no rales, rhonchi, & wheezing  Cardiac: RRR, Nl S1, S2, no Murmurs, rubs or gallops  Vascular: Vessel Right Left  Radial Palpable Palpable  Ulnary Palpable Palpable  Brachial Palpable Palpable  Carotid Palpable, without bruit Palpable, without bruit  Aorta Non-palpable N/A  Femoral Palpable Palpable  Popliteal Non-palpable Non-palpable  PT Non-Palpable Non-Palpable  DP Non-Palpable Non-Palpable   Gastrointestinal: soft, NTND, -G/R, - HSM, - masses, - CVAT B  Musculoskeletal: M/S 5/5 throughout , Extremities without ischemic changes , right groin without hematoma  Neurologic: Pain and light touch intact in extremities , Motor exam as listed above  Medical Decision Making  [name] is a 65 y.o. male who presents s/p  Dx Aortogram and pelvic runoff. Based on his angiographic findings, this patient needs: Aortobifemoral bypass due to compromised right iliac artery system and proximal common femoral artery stenosis (2 mm lumen) The patient will need preoperative cardiology evaluation for risk stratification and preop optimization I discussed in depth with the patient the nature of atherosclerosis, and emphasized the importance of maximal medical management including strict control of blood  pressure, blood glucose, and lipid levels, obtaining regular exercise, and cessation of smoking.  The patient is aware that without maximal medical management the underlying atherosclerotic disease process will progress, limiting the benefit of any interventions.  Thank you for allowing [place] to participate in this patient's care.  Leonides Sake, MD [facility] [place] Office: [phone] Pager: [phone]

## 2011-09-01 ENCOUNTER — Encounter: Payer: Self-pay | Admitting: Cardiovascular Disease

## 2011-09-03 ENCOUNTER — Encounter: Payer: Self-pay | Admitting: *Deleted

## 2011-09-03 ENCOUNTER — Encounter: Payer: Self-pay | Admitting: Cardiovascular Disease

## 2011-09-03 ENCOUNTER — Ambulatory Visit (INDEPENDENT_AMBULATORY_CARE_PROVIDER_SITE_OTHER): Payer: BC Managed Care – PPO | Admitting: Cardiovascular Disease

## 2011-09-03 DIAGNOSIS — Z01810 Encounter for preprocedural cardiovascular examination: Secondary | ICD-10-CM | POA: Insufficient documentation

## 2011-09-03 DIAGNOSIS — I739 Peripheral vascular disease, unspecified: Secondary | ICD-10-CM

## 2011-09-03 NOTE — Progress Notes (Signed)
[name] Date of Birth  10/15/1946 [facility] [address]    [address] [place], [place]  [address] [phone]  Fax  [phone]  History of Present Illness:  [name] is a 65 y.o. gentleman with a Hx of PVD.  He needs to have an aorta-bifem bypass.  He's not had any episodes of chest pain or shortness of breath.  He's been somewhat limited by his claudication but otherwise does not have any problems doing his normal activities.  He had a stress Myoview study in October of last year at which was normal. He has no evidence of ischemia and he has normal left ventricular systolic function.  Current Outpatient Prescriptions on File Prior to Visit  Medication Sig Dispense Refill  . ALBUTEROL IN Inhale into the lungs. Rescue inhaler       . atorvastatin (LIPITOR) 40 MG tablet Take 40 mg by mouth daily.        . budesonide-formoterol (SYMBICORT) 160-4.5 MCG/ACT inhaler Inhale 2 puffs into the lungs 2 (two) times daily.        . Calcium-Vitamin D-Vitamin K 500-100-40 MG-UNT-MCG CHEW Chew by mouth. VIACTIV chew, one by mouth daily       . CIALIS 20 MG tablet       . sertraline (ZOLOFT) 50 MG tablet Take 50 mg by mouth daily.        Marland Kitchen tiotropium (SPIRIVA) 18 MCG inhalation capsule Place 18 mcg into inhaler and inhale daily.        . traZODone (DESYREL) 50 MG tablet Take 50 mg by mouth at bedtime. 1/2 tablet at night       . warfarin (COUMADIN) 1 MG tablet       . warfarin (COUMADIN) 3 MG tablet Take 3 mg by mouth as directed.          Allergies  Allergen Reactions  . Heparin     Past Medical History  Diagnosis Date  . Clotting disorder   . Hypertension   . Hyperlipidemia   . Degenerative disc disease   . Peripheral arterial disease   . Leg fracture, left   . Leg fracture, right   . PVD (peripheral vascular disease)     Past Surgical History  Procedure Date  . Redo r to l femoral-femoral bypass 10/27/10  . Iliofemoral endarterectomy with bovine patch angioplasty  10/27/10  . Thombectomy and fem pop bypass 08/17/10   . Aotogram 07/30/10  . Neck surgery   . Cardiovascular stress test 08/03/2010    EF 53%. NO ISCHEMIA    History  Smoking status  . Former Smoker -- 1.5 packs/day  . Quit date: 08/13/2010  Smokeless tobacco  . Not on file    History  Alcohol Use No    Family History  Problem Relation Age of Onset  . Cancer Mother   . Emphysema Mother   . Lung cancer Mother     Reviw of Systems:  Reviewed in the HPI.  All other systems are negative.  Physical Exam: BP 158/79  Pulse 78  Ht 5' 10.5" (1.791 m)  Wt 180 lb 6.4 oz (81.829 kg)  BMI 25.52 kg/m2 The patient is alert and oriented x 3.  The mood and affect are normal.   Skin: warm and dry.  Color is normal.    HEENT:   Normocephalic/atraumatic. He has across. There is no JVD.  Lungs: His lungs are clear.   Heart: Heart regular rate S1-S2.    Abdomen: Good  bowel sounds. There is no hepatosplenomegaly.  Extremities:  No clubbing cyanosis or edema.  Neuro:  Exam is nonfocal.     Assessment / Plan:

## 2011-09-03 NOTE — Assessment & Plan Note (Signed)
[name] has severe peripheral vascular disease and needs to have an aortobifem bypass. He has had a normal Myoview study. He's not had any cardiac problems. In my opinion, think that he is at relatively low risk for having any cardiovascular complications during his surgery.

## 2011-09-03 NOTE — Patient Instructions (Signed)
Your physician wants you to follow-up in: 6 months,You will receive a reminder letter in the mail two months in advance. If you don't receive a letter, please call our office to schedule the follow-up appointment.   Your physician recommends that you return for a FASTING lipid profile: at 6 month visit

## 2011-09-03 NOTE — Assessment & Plan Note (Signed)
[name] has not had any cardiovascular problems and is at low risk for his upcoming aortobifem bypass surgery.

## 2011-09-30 ENCOUNTER — Encounter: Payer: Self-pay | Admitting: Vascular Surgery

## 2011-10-01 ENCOUNTER — Encounter: Payer: Self-pay | Admitting: Vascular Surgery

## 2011-10-01 ENCOUNTER — Ambulatory Visit (INDEPENDENT_AMBULATORY_CARE_PROVIDER_SITE_OTHER): Payer: BC Managed Care – PPO | Admitting: Vascular Surgery

## 2011-10-01 VITALS — BP 134/81 | HR 79 | Resp 16 | Ht 70.0 in | Wt 177.0 lb

## 2011-10-01 DIAGNOSIS — I739 Peripheral vascular disease, unspecified: Secondary | ICD-10-CM

## 2011-10-01 NOTE — Progress Notes (Signed)
[facility] [place]  History of Present Illness  [name] is a 65 y.o. male who presents with chief complaint: follow up on pre-operative cardiac evaluation.  Dr. Elease Hashimoto has cleared the patient for aortic surgery.  The patient's intermittent claudication remains stable.  This patient has a complicated revascularization hx:  1. R to L fem-fem with L fem-pop bypass with vein 2. TE fem-fem Redo L fem-pop with Propaten 3. 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  Past Medical History  Diagnosis Date  . Clotting disorder   . Hypertension   . Hyperlipidemia   . Degenerative disc disease   . Peripheral arterial disease   . Leg fracture, left   . Leg fracture, right   . PVD (peripheral vascular disease)     Past Surgical History  Procedure Date  . Redo r to l femoral-femoral bypass 10/27/10  . Iliofemoral endarterectomy with bovine patch angioplasty 10/27/10  . Thombectomy and fem pop bypass 08/17/10   . Aotogram 07/30/10  . Neck surgery   . Cardiovascular stress test 08/03/2010    EF 53%. NO ISCHEMIA    History   Social History  . Marital Status: Married    Spouse Name: N/A    Number of Children: N/A  . Years of Education: N/A   Occupational History  . Not on file.   Social History Main Topics  . Smoking status: Former Smoker -- 1.5 packs/day    Quit date: 08/13/2010  . Smokeless tobacco: Not on file  . Alcohol Use: No  . Drug Use:   . Sexually Active:    Other Topics Concern  . Not on file   Social History Narrative  . No narrative on file    Family History  Problem Relation Age of Onset  . Cancer Mother   . Emphysema Mother   . Lung cancer Mother     Current Outpatient Prescriptions on File Prior to Visit  Medication Sig Dispense Refill  . ALBUTEROL IN Inhale into the lungs. Rescue inhaler       . atorvastatin (LIPITOR) 40 MG tablet Take 40 mg by mouth daily.        .  budesonide-formoterol (SYMBICORT) 160-4.5 MCG/ACT inhaler Inhale 2 puffs into the lungs 2 (two) times daily.        . Calcium-Vitamin D-Vitamin K 500-100-40 MG-UNT-MCG CHEW Chew by mouth. VIACTIV chew, one by mouth daily       . CIALIS 20 MG tablet       . IRON PO Take by mouth daily.        . sertraline (ZOLOFT) 50 MG tablet Take 50 mg by mouth daily.        Marland Kitchen tiotropium (SPIRIVA) 18 MCG inhalation capsule Place 18 mcg into inhaler and inhale daily.        . traZODone (DESYREL) 50 MG tablet Take 50 mg by mouth at bedtime. 1/2 tablet at night       . warfarin (COUMADIN) 1 MG tablet       . warfarin (COUMADIN) 3 MG tablet Take 3 mg by mouth as directed.          Allergies  Allergen Reactions  . Heparin      Review of Systems (Positive items checked otherwise negative)  General: [ ]  Weight loss, [ ]  Weight gain, [ ]   Loss of appetite, [ ]  Fever  Neurologic: [ ]  Dizziness, [ ]  Blackouts, [ ]   Headaches, [ ]  Seizure  Ear/Nose/Throat: [ ]  Change in eyesight, [ ]  Change in hearing, [ ]  Nose bleeds, [ ]  Sore throat  Vascular: [x]  Pain in legs with walking, [ ]  Pain in feet while lying flat, [ ]  Non-healing ulcer, Stroke, [ ]  "Mini stroke", [ ]  Slurred speech, [ ]  Temporary blindness, [ ]  Blood clot in vein, [ ]  Phlebitis  Pulmonary: [ ]  Home oxygen, [ ]  Productive cough, [ ]  Bronchitis, [ ]  Coughing up blood, [ ]  Asthma, [ ]  Wheezing, [x]  COPD  Musculoskeletal: [ ]  Arthritis, [ ]  Joint pain, [ ]  Muscle pain  Cardiac: [ ]  Chest pain, [ ]  Chest tightness/pressure, [ ]  Shortness of breath when lying flat, [ ]  Shortness of breath with exertion, [ ]  Palpitations, [ ]  Heart murmur, [ ]  Arrythmia, [ ]  Atrial fibrillation  Hematologic: [ ]  Bleeding problems, [x]  Clotting disorder, [ ]  Anemia  Psychiatric:  [ ]  Depression, [ ]  Anxiety, [ ]  Attention deficit disorder  Gastrointestinal:  [ ]  Black stool,[ ]   Blood in stool, [ ]  Peptic ulcer disease, [ ]  Reflux, [ ]  Hiatal hernia, [ ]  Trouble  swallowing, [ ]  Diarrhea, [ ]  Constipation  Urinary:  [ ]  Kidney disease, [ ]  Burning with urination, [ ]  Frequent urination, [ ]  Difficulty urinating  Skin: [ ]  Ulcers, [ ]  Rashes   Physical Examination  Filed Vitals:   10/01/11 1155  BP: 134/81  Pulse: 79  Resp: 16  Height: 5\' 10"  (1.778 m)  Weight: 177 lb (80.287 kg)  SpO2: 99%   Body mass index is 25.40 kg/(m^2).   General: A&O x 3, WDWN  Head: [place]/AT  Ear/Nose/Throat: Hearing grossly intact, nares w/o erythema or drainage, oropharynx w/o Erythema/Exudate  Eyes: PERRLA, EOMI  Neck: Supple, no nuchal rigidity, no palpable LAD  Pulmonary: Sym exp, good air movt, CTAB, no rales, rhonchi, & wheezing  Cardiac: RRR, Nl S1, S2, no Murmurs, rubs or gallops  Vascular: Vessel Right Left  Radial Palpable Palpable  Brachial Palpable Palpable  Carotid Palpable, without bruit Palpable, without bruit  Aorta Non-palpable N/A  Femoral Palpable Palpable  Popliteal Non-palpable Non-palpable  PT Non-Palpable Non-Palpable  DP Non-Palpable Non-Palpable   Gastrointestinal: soft, NTND, -G/R, - HSM, - masses, - CVAT B  Musculoskeletal: M/S 5/5 throughout , Extremities without ischemic changes , well healed incision B groins, palpable Fem-fem pulse  Neurologic: CN 2-12 intact , Pain and light touch intact in extremities , Motor exam as listed above  Psychiatric: Judgment intact, Mood & affect appropriatefor pt's clinical situation  Dermatologic: See M/S exam for extremity exam, no rashes otherwise noted  Lymph : No Cervical, Axillary, or Inguinal lymphadenopathy   Medical Decision Making  ALVIS EDGELL is a 65 y.o. male who presents with: BLE intermittent claudication with high grade stenosis of proximal CFA/distal EIA   As the R proximal CFA/distal EIA is the inflow for the right to left fem-fem bypass, both legs will be compromised once this vessel occludes subsequently I recommend Aortobifemoral bypass prior to that  happening.  The patient is aware the risks of aortic surgery include but are not limited to: bleeding, need for transfusion, infection, death, stroke, paralysis, wound complications, bowel injuries, impotence, bowel ischemia, extended ventilation, possible future aortoenteric fistula, and future ventral hernias.    Overall, I cited a mortality rate of 5-10% and morbidity rate of 30%.  The patient has agreed to proceed forward and will be schedule in January 2013, depending on the  patient's schedule.  I will be scheduling this procedure as a joint procedure with Dr. Hart Rochester.  Leonides Sake, MD [facility] [place] Office: [phone] Pager: [phone]  10/01/2011, 1:07 PM

## 2011-10-21 ENCOUNTER — Other Ambulatory Visit: Payer: Self-pay | Admitting: *Deleted

## 2011-10-21 ENCOUNTER — Encounter: Payer: Self-pay | Admitting: *Deleted

## 2011-10-26 ENCOUNTER — Encounter (HOSPITAL_COMMUNITY): Payer: Self-pay

## 2011-11-02 ENCOUNTER — Encounter (HOSPITAL_COMMUNITY): Payer: Self-pay

## 2011-11-02 ENCOUNTER — Encounter (HOSPITAL_COMMUNITY)
Admission: RE | Admit: 2011-11-02 | Discharge: 2011-11-02 | Disposition: A | Discharge status: Home / Self Care | Payer: BC Managed Care – PPO | Source: Ambulatory Visit | Attending: Vascular Surgery | Admitting: Vascular Surgery

## 2011-11-02 HISTORY — DX: Chronic obstructive pulmonary disease, unspecified: J44.9

## 2011-11-02 HISTORY — DX: Anxiety disorder, unspecified: F41.9

## 2011-11-02 HISTORY — DX: Major depressive disorder, single episode, unspecified: F32.9

## 2011-11-02 HISTORY — DX: Depression, unspecified: F32.A

## 2011-11-02 LAB — DIFFERENTIAL
Basophils Absolute: 0.1 10*3/uL (ref 0.0–0.1)
Basophils Relative: 1 % (ref 0–1)
Eosinophils Absolute: 0.1 10*3/uL (ref 0.0–0.7)
Eosinophils Relative: 1 % (ref 0–5)
Monocytes Absolute: 0.8 10*3/uL (ref 0.1–1.0)
Monocytes Relative: 8 % (ref 3–12)
Neutro Abs: 7.4 10*3/uL (ref 1.7–7.7)

## 2011-11-02 LAB — URINALYSIS, ROUTINE W REFLEX MICROSCOPIC
Bilirubin Urine: NEGATIVE
Glucose, UA: NEGATIVE mg/dL
Nitrite: NEGATIVE
Specific Gravity, Urine: 1.016 (ref 1.005–1.030)
pH: 5.5 (ref 5.0–8.0)

## 2011-11-02 LAB — COMPREHENSIVE METABOLIC PANEL
ALT: 22 U/L (ref 0–53)
AST: 16 U/L (ref 0–37)
Albumin: 4.3 g/dL (ref 3.5–5.2)
Alkaline Phosphatase: 66 U/L (ref 39–117)
CO2: 27 mEq/L (ref 19–32)
Chloride: 103 mEq/L (ref 96–112)
GFR calc non Af Amer: 88 mL/min — ABNORMAL LOW (ref >90)
Potassium: 4.7 mEq/L (ref 3.5–5.1)
Total Bilirubin: 0.8 mg/dL (ref 0.3–1.2)

## 2011-11-02 LAB — BLOOD GAS, ARTERIAL
Drawn by: 344381
TCO2: 25.4 mmol/L (ref 0–100)
pCO2 arterial: 37 mmHg (ref 35.0–45.0)
pH, Arterial: 7.433 (ref 7.350–7.450)
pO2, Arterial: 43.3 mmHg — ABNORMAL LOW (ref 80.0–100.0)

## 2011-11-02 LAB — CBC
MCV: 90.7 fL (ref 78.0–100.0)
Platelets: 171 10*3/uL (ref 150–400)
RBC: 4.42 MIL/uL (ref 4.22–5.81)
RDW: 13 % (ref 11.5–15.5)
WBC: 10.3 10*3/uL (ref 4.0–10.5)

## 2011-11-02 LAB — SURGICAL PCR SCREEN: MRSA, PCR: POSITIVE — AB

## 2011-11-02 LAB — APTT: aPTT: 44 seconds — ABNORMAL HIGH (ref 24–37)

## 2011-11-02 LAB — PROTIME-INR: INR: 2.29 — ABNORMAL HIGH (ref 0.00–1.49)

## 2011-11-02 LAB — TYPE AND SCREEN

## 2011-11-02 MED ORDER — DEXTROSE 5 % IV SOLN: 1.5000 g | INTRAVENOUS | Status: DC

## 2011-11-02 NOTE — Pre-Procedure Instructions (Signed)
20 [name]  11/02/2011   Your procedure is scheduled on: 11-10-2011 @ 8:30 AM Report to [facility] at 6:30 AM.  Call this number if you have problems the morning of surgery: [phone]   Remember:   Do not eat food:After Midnight.  May have clear liquids: up to 4 Hours before arrival.  Clear liquids include soda, tea, black coffee, apple or grape juice, broth. Until 2:30 AM  Take these medicines the morning of surgery with A SIP OF WATER:proventil inhaler if needed, symbicort,sertraline,   Do not wear jewelry, make-up or nail polish.  Do not wear lotions, powders, or perfumes. You may wear deodorant.  Do not shave 48 hours prior to surgery.  Do not bring valuables to the hospital.  Contacts, dentures or bridgework may not be worn into surgery.  Leave suitcase in the car. After surgery it may be brought to your room.  For patients admitted to the hospital, checkout time is 11:00 AM the day of discharge.    Special Instructions: CHG Shower Use Special Wash: 1/2 bottle night before surgery and 1/2 bottle morning of surgery.   Please read over the following fact sheets that you were given: Pain Booklet, Blood Transfusion Information, MRSA Information and Surgical Site Infection Prevention

## 2011-11-02 NOTE — Pre-Procedure Instructions (Signed)
20 TANVIR HIPPLE  11/02/2011   Your procedure is scheduled on: 11-10-2011 @ 8:30 AM  Report to [facility] at 6:30 AM  Call this number if you have problems the morning of surgery: [phone]   Remember:   Do not eat food:After Midnight.  May have clear liquids: up to 4 Hours before arrival.  Clear liquids include soda, tea, black coffee, apple or grape juice, broth. 2:30 AM    Take these medicines the morning of surgery with A SIP OF WATER:albuterol inhaler if needed,symbicort,sertraline  Do not wear jewelry, make-up or nail polish.  Do not wear lotions, powders, or perfumes. You may wear deodorant.  Do not shave 48 hours prior to surgery.  Do not bring valuables to the hospital.  Contacts, dentures or bridgework may not be worn into surgery.  Leave suitcase in the car. After surgery it may be brought to your room.  For patients admitted to the hospital, checkout time is 11:00 AM the day of discharge.    Special Instructions: CHG Shower Use Special Wash: 1/2 bottle night before surgery and 1/2 bottle morning of surgery.   Please read over the following fact sheets that you were given: Blood Transfusion Information, MRSA Information and Surgical Site Infection Prevention

## 2011-11-02 NOTE — Progress Notes (Signed)
Pt. To talk with Dr. [facility] and Dr. Imogene Burn office about stoppong coumadin

## 2011-11-03 NOTE — Consult Note (Addendum)
Anesthesia:  Patient is a 66 year old male scheduled for an AFBG on 11/10/11.  His history includes HLD, PAD, COPD, former smoker, anxiety, depression, previous right to left FFBG and left FPBG in 08/2010, and hypercoagulable state with history of HIT and + lupus anticoagulant.  He has see a Hematologist in [facility], but not recently.    He was seen by Dr. Elease Hashimoto for preoperative Cardiac evaluation on 09/03/11.  He had a normal Myoview on 08/03/10 (see Notes tab, Card Nuclear Visit) that was normal with EF 53%.  Ultimately, he was felt low risk for perioperative CV complication.  EKG from 07/29/11 showed SR with PACs.   CXR showed COPD, no acute cardiopulmonary abnormality.  I was asked to review his labs which show a significantly decreased H/H of 7.3/40.1 down from 16.0/47.0 on 07/29/11. His INR was 2.29 and PTT 44.  ABG was significant for a pO2 of 43.3.  CO2 was 37.0, pH 7.43, and bicarb 24.3.  I called these results to on-call [facility] physician Dr. Hart Rochester.  He would like patient to come in to Short Stay tomorrow for repeat lab work.  I spoke with Mr. Baby who says he feels in his usual state of health without dizziness or acute respiratory symptoms.  He says he can come in to Short Stay around lunch time tomorrow.  I will update Dr. Imogene Burn in the morning.  Addendum:  11/04/11 1050  I spoke with Dr. Imogene Burn earlier this morning regarding abnormal labs.  He requests that only the CBC be repeated today and repeat the ABG on the day of surgery.  However, Anesthesiologist Dr. Katrinka Blazing feels it is best to repeat the ABG today instead.  Patient has already come in today for labs.  He did not appear pale.  He denied SOB/CP/dizziness/hematuria/hematochezia/large areas of ecchymosis.  Lungs were clear.  He actually has a new Pulmonologist Dr. Darcella Gasman that he is scheduled to see at [facility].  He says he is no longer seeing a Hematologist there.  He is seen at the [facility] at [facility], however.  Lamar Blinks, RN at [facility] updated.  She will be following-up on his lab results as well.  If his ABG is still abnormal, I've asked her to make sure Dr. Darcella Gasman is made aware prior to patient's appointment tomorrow.  She is suppose to contact Mr. Capuano today re: Coumadin instructions.  Addendum: 11/09/11 1630  Repeat CBC and ABG results noted on 11/04/11.  His last Pulmonology note was requested, but never received from [facility].  He is for repeat coags tomorrow.  Since there appeared to be lab error with his 11/02/11 CBC and ABG, I would favor repeating other labs on arrival tomorrow.

## 2011-11-04 ENCOUNTER — Encounter (HOSPITAL_COMMUNITY)
Admission: RE | Admit: 2011-11-04 | Discharge: 2011-11-04 | Disposition: A | Discharge status: Home / Self Care | Payer: BC Managed Care – PPO | Source: Ambulatory Visit | Attending: Vascular Surgery | Admitting: Vascular Surgery

## 2011-11-04 DIAGNOSIS — I739 Peripheral vascular disease, unspecified: Secondary | ICD-10-CM

## 2011-11-04 LAB — BLOOD GAS, ARTERIAL
Drawn by: 181601
pCO2 arterial: 29.7 mmHg — ABNORMAL LOW (ref 35.0–45.0)
pH, Arterial: 7.468 — ABNORMAL HIGH (ref 7.350–7.450)

## 2011-11-04 LAB — CBC
HCT: 45 % (ref 39.0–52.0)
Platelets: 246 10*3/uL (ref 150–400)
RBC: 5.06 MIL/uL (ref 4.22–5.81)
RDW: 13.1 % (ref 11.5–15.5)
WBC: 9.6 10*3/uL (ref 4.0–10.5)

## 2011-11-09 MED ORDER — CEFUROXIME SODIUM 1.5 G IJ SOLR
1.5000 g | Freq: Once | INTRAMUSCULAR | Status: DC
  Filled 2011-11-09: qty 1.5

## 2011-11-10 ENCOUNTER — Encounter (HOSPITAL_COMMUNITY): Payer: Self-pay | Admitting: Vascular Surgery

## 2011-11-10 ENCOUNTER — Ambulatory Visit (HOSPITAL_COMMUNITY): Payer: BC Managed Care – PPO

## 2011-11-10 ENCOUNTER — Encounter (HOSPITAL_COMMUNITY): Payer: Self-pay | Admitting: *Deleted

## 2011-11-10 ENCOUNTER — Encounter (HOSPITAL_COMMUNITY)
Admission: RE | Disposition: A | Discharge status: Home Health | Payer: Self-pay | Source: Ambulatory Visit | Attending: Vascular Surgery

## 2011-11-10 ENCOUNTER — Other Ambulatory Visit: Payer: Self-pay

## 2011-11-10 ENCOUNTER — Ambulatory Visit (HOSPITAL_COMMUNITY): Payer: BC Managed Care – PPO | Admitting: Vascular Surgery

## 2011-11-10 ENCOUNTER — Inpatient Hospital Stay (HOSPITAL_COMMUNITY)
Admission: RE | Admit: 2011-11-10 | Discharge: 2011-11-18 | DRG: 549 | Disposition: A | Discharge status: Home Health | Payer: BC Managed Care – PPO | Source: Ambulatory Visit | Attending: Vascular Surgery | Admitting: Vascular Surgery

## 2011-11-10 DIAGNOSIS — I70219 Atherosclerosis of native arteries of extremities with intermittent claudication, unspecified extremity: Secondary | ICD-10-CM

## 2011-11-10 DIAGNOSIS — J9620 Acute and chronic respiratory failure, unspecified whether with hypoxia or hypercapnia: Secondary | ICD-10-CM | POA: Diagnosis not present

## 2011-11-10 DIAGNOSIS — Z7901 Long term (current) use of anticoagulants: Secondary | ICD-10-CM

## 2011-11-10 DIAGNOSIS — E876 Hypokalemia: Secondary | ICD-10-CM | POA: Diagnosis not present

## 2011-11-10 DIAGNOSIS — J4489 Other specified chronic obstructive pulmonary disease: Secondary | ICD-10-CM | POA: Diagnosis present

## 2011-11-10 DIAGNOSIS — D6859 Other primary thrombophilia: Secondary | ICD-10-CM | POA: Diagnosis present

## 2011-11-10 DIAGNOSIS — Z87891 Personal history of nicotine dependence: Secondary | ICD-10-CM

## 2011-11-10 DIAGNOSIS — D72829 Elevated white blood cell count, unspecified: Secondary | ICD-10-CM | POA: Diagnosis not present

## 2011-11-10 DIAGNOSIS — F341 Dysthymic disorder: Secondary | ICD-10-CM | POA: Diagnosis present

## 2011-11-10 DIAGNOSIS — I739 Peripheral vascular disease, unspecified: Secondary | ICD-10-CM

## 2011-11-10 DIAGNOSIS — D62 Acute posthemorrhagic anemia: Secondary | ICD-10-CM | POA: Diagnosis not present

## 2011-11-10 DIAGNOSIS — R0902 Hypoxemia: Secondary | ICD-10-CM | POA: Diagnosis not present

## 2011-11-10 DIAGNOSIS — J449 Chronic obstructive pulmonary disease, unspecified: Secondary | ICD-10-CM | POA: Diagnosis present

## 2011-11-10 DIAGNOSIS — Z01812 Encounter for preprocedural laboratory examination: Secondary | ICD-10-CM

## 2011-11-10 DIAGNOSIS — E785 Hyperlipidemia, unspecified: Secondary | ICD-10-CM | POA: Diagnosis present

## 2011-11-10 DIAGNOSIS — Z79899 Other long term (current) drug therapy: Secondary | ICD-10-CM

## 2011-11-10 HISTORY — PX: AORTA - BILATERAL FEMORAL ARTERY BYPASS GRAFT: SHX1175

## 2011-11-10 LAB — CBC
HCT: 31.8 % — ABNORMAL LOW (ref 39.0–52.0)
Hemoglobin: 10.8 g/dL — ABNORMAL LOW (ref 13.0–17.0)
MCH: 31.7 pg (ref 26.0–34.0)
MCHC: 34.8 g/dL (ref 30.0–36.0)
MCHC: 34 g/dL (ref 30.0–36.0)
MCV: 90.9 fL (ref 78.0–100.0)
Platelets: 226 10*3/uL (ref 150–400)
RDW: 13.1 % (ref 11.5–15.5)
WBC: 13.1 10*3/uL — ABNORMAL HIGH (ref 4.0–10.5)

## 2011-11-10 LAB — POCT I-STAT 7, (LYTES, BLD GAS, ICA,H+H)
Acid-base deficit: 2 mmol/L (ref 0.0–2.0)
Acid-base deficit: 2 mmol/L (ref 0.0–2.0)
Bicarbonate: 25.5 mEq/L — ABNORMAL HIGH (ref 20.0–24.0)
Bicarbonate: 23.4 mEq/L (ref 20.0–24.0)
Calcium, Ion: 1.22 mmol/L (ref 1.12–1.32)
Calcium, Ion: 1.22 mmol/L (ref 1.12–1.32)
HCT: 30 % — ABNORMAL LOW (ref 39.0–52.0)
Hemoglobin: 10.2 g/dL — ABNORMAL LOW (ref 13.0–17.0)
O2 Saturation: 100 %
Patient temperature: 36.2
TCO2: 27 mmol/L (ref 0–100)
pCO2 arterial: 53.9 mmHg — ABNORMAL HIGH (ref 35.0–45.0)
pCO2 arterial: 41.7 mmHg (ref 35.0–45.0)
pH, Arterial: 7.355 (ref 7.350–7.450)
pO2, Arterial: 423 mmHg — ABNORMAL HIGH (ref 80.0–100.0)
pO2, Arterial: 260 mmHg — ABNORMAL HIGH (ref 80.0–100.0)

## 2011-11-10 LAB — BLOOD GAS, ARTERIAL
O2 Content: 10 L/min
TCO2: 25.4 mmol/L (ref 0–100)
pCO2 arterial: 42.8 mmHg (ref 35.0–45.0)
pH, Arterial: 7.364 (ref 7.350–7.450)

## 2011-11-10 LAB — BASIC METABOLIC PANEL
BUN: 11 mg/dL (ref 6–23)
GFR calc non Af Amer: >90 mL/min (ref >90)
Glucose, Bld: 150 mg/dL — ABNORMAL HIGH (ref 70–99)
Potassium: 4.6 mEq/L (ref 3.5–5.1)

## 2011-11-10 LAB — MAGNESIUM: Magnesium: 1.4 mg/dL — ABNORMAL LOW (ref 1.5–2.5)

## 2011-11-10 LAB — COMPREHENSIVE METABOLIC PANEL
AST: 15 U/L (ref 0–37)
Albumin: 3.8 g/dL (ref 3.5–5.2)
Alkaline Phosphatase: 69 U/L (ref 39–117)
BUN: 14 mg/dL (ref 6–23)
Chloride: 105 mEq/L (ref 96–112)
Potassium: 4.6 mEq/L (ref 3.5–5.1)
Total Bilirubin: 0.5 mg/dL (ref 0.3–1.2)

## 2011-11-10 LAB — POCT I-STAT 3, ART BLOOD GAS (G3+)
Acid-base deficit: 1 mmol/L (ref 0.0–2.0)
O2 Saturation: 91 %
TCO2: 26 mmol/L (ref 0–100)

## 2011-11-10 LAB — PROTIME-INR: Prothrombin Time: 14.5 seconds (ref 11.6–15.2)

## 2011-11-10 LAB — APTT: aPTT: 53 seconds — ABNORMAL HIGH (ref 24–37)

## 2011-11-10 SURGERY — CREATION, BYPASS, ARTERIAL, AORTA TO FEMORAL, BILATERAL, USING GRAFT
Anesthesia: General | Site: Abdomen | Wound class: Clean

## 2011-11-10 MED ORDER — OXYCODONE-ACETAMINOPHEN 5-325 MG PO TABS
1.0000 | ORAL_TABLET | ORAL | Status: DC | PRN
  Administered 2011-11-14 – 2011-11-15 (×2): 2 via ORAL
  Filled 2011-11-10 (×2): qty 2

## 2011-11-10 MED ORDER — THROMBIN 20000 UNITS EX KIT
PACK | CUTANEOUS | Status: DC | PRN
  Administered 2011-11-10: 13:00:00 via TOPICAL

## 2011-11-10 MED ORDER — DEXTROSE 5 % IV SOLN
1.5000 g | INTRAVENOUS | Status: DC | PRN
  Administered 2011-11-10: 1.5 g via INTRAVENOUS

## 2011-11-10 MED ORDER — DEXTROSE 5 % IV SOLN
INTRAVENOUS | Status: AC
  Filled 2011-11-10: qty 50

## 2011-11-10 MED ORDER — LACTATED RINGERS IV SOLN
INTRAVENOUS | Status: DC | PRN
  Administered 2011-11-10 (×2): via INTRAVENOUS

## 2011-11-10 MED ORDER — EPHEDRINE SULFATE 50 MG/ML IJ SOLN
INTRAMUSCULAR | Status: DC | PRN
  Administered 2011-11-10: 10 mg via INTRAVENOUS

## 2011-11-10 MED ORDER — GUAIFENESIN-DM 100-10 MG/5ML PO SYRP: 15.0000 mL | ORAL_SOLUTION | ORAL | Status: DC | PRN

## 2011-11-10 MED ORDER — BIVALIRUDIN 250 MG IV SOLR
1.7500 mg/kg/h | INTRAVENOUS | Status: DC
  Filled 2011-11-10: qty 250

## 2011-11-10 MED ORDER — METOPROLOL TARTRATE 1 MG/ML IV SOLN: 2.0000 mg | INTRAVENOUS | Status: DC | PRN

## 2011-11-10 MED ORDER — BIVALIRUDIN BOLUS VIA INFUSION: 0.7500 mg/kg | Freq: Once | INTRAVENOUS | Status: DC

## 2011-11-10 MED ORDER — 0.9 % SODIUM CHLORIDE (POUR BTL) OPTIME
TOPICAL | Status: DC | PRN
  Administered 2011-11-10: 3000 mL

## 2011-11-10 MED ORDER — ACETAMINOPHEN 325 MG PO TABS: 325.0000 mg | ORAL_TABLET | ORAL | Status: DC | PRN

## 2011-11-10 MED ORDER — PHENYLEPHRINE HCL 10 MG/ML IJ SOLN
INTRAMUSCULAR | Status: DC | PRN
  Administered 2011-11-10 (×6): 80 ug via INTRAVENOUS

## 2011-11-10 MED ORDER — HEMOSTATIC AGENTS (NO CHARGE) OPTIME
TOPICAL | Status: DC | PRN
  Administered 2011-11-10: 2 via TOPICAL

## 2011-11-10 MED ORDER — LABETALOL HCL 5 MG/ML IV SOLN: 10.0000 mg | INTRAVENOUS | Status: DC | PRN

## 2011-11-10 MED ORDER — SERTRALINE HCL 50 MG PO TABS
50.0000 mg | ORAL_TABLET | Freq: Every day | ORAL | Status: DC
  Filled 2011-11-10: qty 1

## 2011-11-10 MED ORDER — DOCUSATE SODIUM 100 MG PO CAPS: 100.0000 mg | ORAL_CAPSULE | Freq: Every day | ORAL | Status: DC

## 2011-11-10 MED ORDER — LIDOCAINE HCL (CARDIAC) 20 MG/ML IV SOLN
INTRAVENOUS | Status: DC | PRN
  Administered 2011-11-10: 100 mg via INTRAVENOUS

## 2011-11-10 MED ORDER — FAMOTIDINE IN NACL 20-0.9 MG/50ML-% IV SOLN
20.0000 mg | Freq: Two times a day (BID) | INTRAVENOUS | Status: DC
  Administered 2011-11-10 – 2011-11-12 (×4): 20 mg via INTRAVENOUS
  Filled 2011-11-10 (×5): qty 50

## 2011-11-10 MED ORDER — MORPHINE SULFATE 2 MG/ML IJ SOLN: 0.0500 mg/kg | INTRAMUSCULAR | Status: DC | PRN

## 2011-11-10 MED ORDER — CEFUROXIME SODIUM 1.5 G IJ SOLR
INTRAMUSCULAR | Status: AC
  Filled 2011-11-10: qty 1.5

## 2011-11-10 MED ORDER — SODIUM CHLORIDE 0.9 % IV SOLN
INTRAVENOUS | Status: DC | PRN
  Administered 2011-11-10: 500 mL via INTRAMUSCULAR

## 2011-11-10 MED ORDER — WARFARIN SODIUM 2 MG PO TABS
2.0000 mg | ORAL_TABLET | ORAL | Status: DC
  Filled 2011-11-10: qty 1

## 2011-11-10 MED ORDER — ONDANSETRON HCL 4 MG/2ML IJ SOLN
INTRAMUSCULAR | Status: DC | PRN
  Administered 2011-11-10: 4 mg via INTRAVENOUS

## 2011-11-10 MED ORDER — FERROUS FUMARATE 325 (106 FE) MG PO TABS
1.0000 | ORAL_TABLET | Freq: Every day | ORAL | Status: DC
  Filled 2011-11-10: qty 1

## 2011-11-10 MED ORDER — BIVALIRUDIN 250 MG IV SOLR
1.7500 mg/kg/h | INTRAVENOUS | Status: DC
  Filled 2011-11-10 (×2): qty 250

## 2011-11-10 MED ORDER — TRAZODONE 25 MG HALF TABLET
25.0000 mg | ORAL_TABLET | Freq: Every day | ORAL | Status: DC
  Filled 2011-11-10: qty 1

## 2011-11-10 MED ORDER — MEPERIDINE HCL 25 MG/ML IJ SOLN: 6.2500 mg | INTRAMUSCULAR | Status: DC | PRN

## 2011-11-10 MED ORDER — BISACODYL 10 MG RE SUPP
10.0000 mg | Freq: Every day | RECTAL | Status: DC | PRN
  Filled 2011-11-10: qty 1

## 2011-11-10 MED ORDER — SENNOSIDES-DOCUSATE SODIUM 8.6-50 MG PO TABS
1.0000 | ORAL_TABLET | Freq: Every evening | ORAL | Status: DC | PRN
  Filled 2011-11-10: qty 1

## 2011-11-10 MED ORDER — ACETAMINOPHEN 650 MG RE SUPP: 325.0000 mg | RECTAL | Status: DC | PRN

## 2011-11-10 MED ORDER — POTASSIUM CHLORIDE CRYS ER 20 MEQ PO TBCR: 20.0000 meq | EXTENDED_RELEASE_TABLET | Freq: Once | ORAL | Status: AC | PRN

## 2011-11-10 MED ORDER — MIDAZOLAM HCL 5 MG/5ML IJ SOLN
INTRAMUSCULAR | Status: DC | PRN
  Administered 2011-11-10 (×2): 1 mg via INTRAVENOUS

## 2011-11-10 MED ORDER — SIMVASTATIN 40 MG PO TABS: 40.0000 mg | ORAL_TABLET | Freq: Every day | ORAL | Status: DC

## 2011-11-10 MED ORDER — FLEET ENEMA 7-19 GM/118ML RE ENEM
1.0000 | ENEMA | Freq: Once | RECTAL | Status: AC | PRN
  Filled 2011-11-10: qty 1

## 2011-11-10 MED ORDER — ALBUMIN HUMAN 5 % IV SOLN
INTRAVENOUS | Status: DC | PRN
  Administered 2011-11-10 (×2): via INTRAVENOUS

## 2011-11-10 MED ORDER — TIOTROPIUM BROMIDE MONOHYDRATE 18 MCG IN CAPS: 18.0000 ug | ORAL_CAPSULE | Freq: Every day | RESPIRATORY_TRACT | Status: DC

## 2011-11-10 MED ORDER — MANNITOL 25 % IV SOLN
INTRAVENOUS | Status: DC | PRN
  Administered 2011-11-10: 25 g via INTRAVENOUS

## 2011-11-10 MED ORDER — IPRATROPIUM BROMIDE 0.02 % IN SOLN
0.5000 mg | RESPIRATORY_TRACT | Status: DC
  Administered 2011-11-10 – 2011-11-11 (×4): 0.5 mg via RESPIRATORY_TRACT
  Filled 2011-11-10 (×4): qty 2.5

## 2011-11-10 MED ORDER — SODIUM CHLORIDE 0.9 % IV SOLN
250.0000 mg | INTRAVENOUS | Status: DC | PRN
  Administered 2011-11-10: 1.75 mg/kg/h via INTRAVENOUS

## 2011-11-10 MED ORDER — SODIUM CHLORIDE 0.9 % IV SOLN: 0.2500 mg/kg/h | INTRAVENOUS | Status: DC

## 2011-11-10 MED ORDER — HETASTARCH-ELECTROLYTES 6 % IV SOLN
INTRAVENOUS | Status: DC | PRN
  Administered 2011-11-10: 12:00:00 via INTRAVENOUS

## 2011-11-10 MED ORDER — ROCURONIUM BROMIDE 100 MG/10ML IV SOLN
INTRAVENOUS | Status: DC | PRN
  Administered 2011-11-10: 50 mg via INTRAVENOUS

## 2011-11-10 MED ORDER — TIOTROPIUM BROMIDE MONOHYDRATE 18 MCG IN CAPS
18.0000 ug | ORAL_CAPSULE | Freq: Every day | RESPIRATORY_TRACT | Status: DC
Start: 2011-11-10 — End: 2011-11-11
  Filled 2011-11-10: qty 5

## 2011-11-10 MED ORDER — PROPOFOL 10 MG/ML IV EMUL
INTRAVENOUS | Status: DC | PRN
  Administered 2011-11-10: 200 mg via INTRAVENOUS
  Administered 2011-11-10: 20 mg via INTRAVENOUS

## 2011-11-10 MED ORDER — LACTATED RINGERS IV SOLN
INTRAVENOUS | Status: DC | PRN
  Administered 2011-11-10 (×2): via INTRAVENOUS

## 2011-11-10 MED ORDER — SODIUM CHLORIDE 0.9 % IV SOLN
0.2500 mg/kg/h | INTRAVENOUS | Status: DC
  Filled 2011-11-10: qty 250

## 2011-11-10 MED ORDER — CALCIUM-VITAMIN D-VITAMIN K 500-1000-40 MG-UNT-MCG PO CHEW: 2.0000 | CHEWABLE_TABLET | Freq: Every day | ORAL | Status: DC

## 2011-11-10 MED ORDER — ADULT MULTIVITAMIN W/MINERALS CH: 1.0000 | ORAL_TABLET | Freq: Every day | ORAL | Status: DC

## 2011-11-10 MED ORDER — GLYCOPYRROLATE 0.2 MG/ML IJ SOLN
INTRAMUSCULAR | Status: DC | PRN
  Administered 2011-11-10: .6 mg via INTRAVENOUS

## 2011-11-10 MED ORDER — NEOSTIGMINE METHYLSULFATE 1 MG/ML IJ SOLN
INTRAMUSCULAR | Status: DC | PRN
  Administered 2011-11-10: 4 mg via INTRAVENOUS

## 2011-11-10 MED ORDER — BUDESONIDE-FORMOTEROL FUMARATE 160-4.5 MCG/ACT IN AERO
2.0000 | INHALATION_SPRAY | Freq: Two times a day (BID) | RESPIRATORY_TRACT | Status: DC
  Administered 2011-11-10 – 2011-11-11 (×2): 2 via RESPIRATORY_TRACT
  Filled 2011-11-10: qty 6

## 2011-11-10 MED ORDER — ALBUTEROL SULFATE (5 MG/ML) 0.5% IN NEBU
2.5000 mg | INHALATION_SOLUTION | RESPIRATORY_TRACT | Status: DC
  Administered 2011-11-10 – 2011-11-11 (×4): 2.5 mg via RESPIRATORY_TRACT
  Filled 2011-11-10 (×4): qty 0.5

## 2011-11-10 MED ORDER — SODIUM CHLORIDE 0.9 % IV SOLN
500.0000 mL | Freq: Once | INTRAVENOUS | Status: AC | PRN
  Administered 2011-11-10: 500 mL via INTRAVENOUS

## 2011-11-10 MED ORDER — ALBUTEROL SULFATE HFA 108 (90 BASE) MCG/ACT IN AERS
2.0000 | INHALATION_SPRAY | Freq: Four times a day (QID) | RESPIRATORY_TRACT | Status: DC | PRN
  Filled 2011-11-10: qty 6.7

## 2011-11-10 MED ORDER — VITAMIN D3 25 MCG (1000 UNIT) PO TABS: 1000.0000 [IU] | ORAL_TABLET | Freq: Every day | ORAL | Status: DC

## 2011-11-10 MED ORDER — ONDANSETRON HCL 4 MG/2ML IJ SOLN: 4.0000 mg | Freq: Four times a day (QID) | INTRAMUSCULAR | Status: DC | PRN

## 2011-11-10 MED ORDER — CEFUROXIME SODIUM 1.5 G IJ SOLR
1.5000 g | Freq: Two times a day (BID) | INTRAMUSCULAR | Status: AC
  Administered 2011-11-10 – 2011-11-11 (×2): 1.5 g via INTRAVENOUS
  Filled 2011-11-10 (×2): qty 1.5

## 2011-11-10 MED ORDER — CALCIUM CARBONATE 1250 (500 CA) MG PO TABS
1.0000 | ORAL_TABLET | Freq: Two times a day (BID) | ORAL | Status: DC
  Filled 2011-11-10 (×2): qty 1

## 2011-11-10 MED ORDER — CALCIUM CARBONATE ANTACID 500 MG PO CHEW
1.0000 | CHEWABLE_TABLET | Freq: Two times a day (BID) | ORAL | Status: DC
  Filled 2011-11-10 (×2): qty 1

## 2011-11-10 MED ORDER — IPRATROPIUM-ALBUTEROL 0.5-2.5 (3) MG/3ML IN SOLN
3.0000 mL | RESPIRATORY_TRACT | Status: DC
  Filled 2011-11-10 (×20): qty 3

## 2011-11-10 MED ORDER — SODIUM CHLORIDE 0.9 % IV SOLN
10.0000 mg | INTRAVENOUS | Status: DC | PRN
  Administered 2011-11-10: 10 ug/min via INTRAVENOUS

## 2011-11-10 MED ORDER — MORPHINE SULFATE 2 MG/ML IJ SOLN
2.0000 mg | INTRAMUSCULAR | Status: DC | PRN
  Administered 2011-11-10: 2 mg via INTRAVENOUS
  Administered 2011-11-11: 4 mg via INTRAVENOUS
  Administered 2011-11-11: 2 mg via INTRAVENOUS
  Administered 2011-11-11: 4 mg via INTRAVENOUS
  Administered 2011-11-11: 2 mg via INTRAVENOUS
  Administered 2011-11-11 (×2): 4 mg via INTRAVENOUS
  Administered 2011-11-11: 2 mg via INTRAVENOUS
  Administered 2011-11-11 – 2011-11-12 (×5): 4 mg via INTRAVENOUS
  Administered 2011-11-12: 2 mg via INTRAVENOUS
  Administered 2011-11-12: 4 mg via INTRAVENOUS
  Administered 2011-11-12 – 2011-11-13 (×2): 2 mg via INTRAVENOUS
  Filled 2011-11-10: qty 1
  Filled 2011-11-10: qty 2
  Filled 2011-11-10: qty 1
  Filled 2011-11-10 (×3): qty 2
  Filled 2011-11-10: qty 1
  Filled 2011-11-10: qty 2
  Filled 2011-11-10 (×3): qty 1
  Filled 2011-11-10 (×4): qty 2
  Filled 2011-11-10: qty 1
  Filled 2011-11-10 (×2): qty 2
  Filled 2011-11-10: qty 1

## 2011-11-10 MED ORDER — HYDRALAZINE HCL 20 MG/ML IJ SOLN
10.0000 mg | INTRAMUSCULAR | Status: DC | PRN
  Filled 2011-11-10: qty 0.5

## 2011-11-10 MED ORDER — HYDROMORPHONE HCL PF 1 MG/ML IJ SOLN
0.2500 mg | INTRAMUSCULAR | Status: DC | PRN
  Administered 2011-11-10 (×2): 0.5 mg via INTRAVENOUS

## 2011-11-10 MED ORDER — ONDANSETRON HCL 4 MG/2ML IJ SOLN: 4.0000 mg | Freq: Once | INTRAMUSCULAR | Status: DC | PRN

## 2011-11-10 MED ORDER — SODIUM CHLORIDE 0.9 % IV SOLN: INTRAVENOUS | Status: DC

## 2011-11-10 MED ORDER — MAGNESIUM SULFATE 40 MG/ML IJ SOLN: 2.0000 g | Freq: Once | INTRAMUSCULAR | Status: AC | PRN

## 2011-11-10 MED ORDER — DOPAMINE-DEXTROSE 3.2-5 MG/ML-% IV SOLN
3.0000 ug/kg/min | INTRAVENOUS | Status: DC
  Filled 2011-11-10: qty 250

## 2011-11-10 MED ORDER — LACTATED RINGERS IV SOLN
INTRAVENOUS | Status: DC | PRN
  Administered 2011-11-10: 09:00:00 via INTRAVENOUS

## 2011-11-10 MED ORDER — PHENOL 1.4 % MT LIQD: 1.0000 | OROMUCOSAL | Status: DC | PRN

## 2011-11-10 MED ORDER — BIVALIRUDIN BOLUS VIA INFUSION
0.7500 mg/kg | Freq: Once | INTRAVENOUS | Status: DC
  Filled 2011-11-10: qty 1

## 2011-11-10 MED ORDER — ROSUVASTATIN CALCIUM 20 MG PO TABS
20.0000 mg | ORAL_TABLET | Freq: Every day | ORAL | Status: DC
  Filled 2011-11-10: qty 1

## 2011-11-10 MED ORDER — ALBUTEROL SULFATE HFA 108 (90 BASE) MCG/ACT IN AERS
INHALATION_SPRAY | RESPIRATORY_TRACT | Status: DC | PRN
  Administered 2011-11-10: 2 via RESPIRATORY_TRACT

## 2011-11-10 MED ORDER — VECURONIUM BROMIDE 10 MG IV SOLR
INTRAVENOUS | Status: DC | PRN
  Administered 2011-11-10: 2 mg via INTRAVENOUS
  Administered 2011-11-10 (×2): 1 mg via INTRAVENOUS
  Administered 2011-11-10: 3 mg via INTRAVENOUS
  Administered 2011-11-10 (×5): 2 mg via INTRAVENOUS

## 2011-11-10 MED ORDER — SODIUM CHLORIDE 0.9 % IV SOLN
INTRAVENOUS | Status: DC
  Administered 2011-11-10 – 2011-11-14 (×2): via INTRAVENOUS

## 2011-11-10 MED ORDER — FENTANYL CITRATE 0.05 MG/ML IJ SOLN
INTRAMUSCULAR | Status: DC | PRN
  Administered 2011-11-10 (×3): 50 ug via INTRAVENOUS
  Administered 2011-11-10: 100 ug via INTRAVENOUS
  Administered 2011-11-10 (×4): 50 ug via INTRAVENOUS
  Administered 2011-11-10: 100 ug via INTRAVENOUS
  Administered 2011-11-10 (×2): 50 ug via INTRAVENOUS
  Administered 2011-11-10: 100 ug via INTRAVENOUS
  Administered 2011-11-10: 50 ug via INTRAVENOUS

## 2011-11-10 SURGICAL SUPPLY — 65 items
ADH SKN CLS LQ APL DERMABOND (GAUZE/BANDAGES/DRESSINGS) ×1
CANISTER SUCTION 2500CC (MISCELLANEOUS) ×2 IMPLANT
CLIP TI MEDIUM 24 (CLIP) ×2 IMPLANT
CLIP TI WIDE RED SMALL 24 (CLIP) ×2 IMPLANT
CLOTH BEACON ORANGE TIMEOUT ST (SAFETY) ×2 IMPLANT
COVER SURGICAL LIGHT HANDLE (MISCELLANEOUS) ×4 IMPLANT
DERMABOND ADHESIVE PROPEN (GAUZE/BANDAGES/DRESSINGS) ×1
DERMABOND ADVANCED .7 DNX6 (GAUZE/BANDAGES/DRESSINGS) ×1 IMPLANT
DRAPE INCISE IOBAN 66X45 STRL (DRAPES) IMPLANT
DRAPE WARM FLUID 44X44 (DRAPE) ×2 IMPLANT
DRSG COVADERM 4X14 (GAUZE/BANDAGES/DRESSINGS) ×2 IMPLANT
DRSG COVADERM 4X8 (GAUZE/BANDAGES/DRESSINGS) ×6 IMPLANT
ELECT BLADE 4.0 EZ CLEAN MEGAD (MISCELLANEOUS) ×2
ELECT REM PT RETURN 9FT ADLT (ELECTROSURGICAL) ×2
ELECTRODE BLDE 4.0 EZ CLN MEGD (MISCELLANEOUS) ×1 IMPLANT
ELECTRODE REM PT RTRN 9FT ADLT (ELECTROSURGICAL) ×1 IMPLANT
GEL ULTRASOUND 20GR AQUASONIC (MISCELLANEOUS) ×2 IMPLANT
GLOVE BIO SURGEON STRL SZ 6.5 (GLOVE) ×8 IMPLANT
GLOVE BIO SURGEON STRL SZ7 (GLOVE) ×4 IMPLANT
GLOVE BIOGEL PI IND STRL 7.0 (GLOVE) ×2 IMPLANT
GLOVE BIOGEL PI IND STRL 7.5 (GLOVE) ×2 IMPLANT
GLOVE BIOGEL PI INDICATOR 7.0 (GLOVE) ×2
GLOVE BIOGEL PI INDICATOR 7.5 (GLOVE) ×2
GLOVE SS BIOGEL STRL SZ 6.5 (GLOVE) ×1 IMPLANT
GLOVE SS BIOGEL STRL SZ 7 (GLOVE) ×1 IMPLANT
GLOVE SUPERSENSE BIOGEL SZ 6.5 (GLOVE) ×1
GLOVE SUPERSENSE BIOGEL SZ 7 (GLOVE) ×1
GOWN STRL NON-REIN LRG LVL3 (GOWN DISPOSABLE) ×12 IMPLANT
GRAFT HEMASHIELD 14X8MM (Vascular Products) ×2 IMPLANT
INSERT FOGARTY 61MM (MISCELLANEOUS) ×2 IMPLANT
INSERT FOGARTY SM (MISCELLANEOUS) ×4 IMPLANT
KIT BASIN OR (CUSTOM PROCEDURE TRAY) ×2 IMPLANT
KIT ROOM TURNOVER OR (KITS) ×2 IMPLANT
NS IRRIG 1000ML POUR BTL (IV SOLUTION) ×4 IMPLANT
PACK AORTA (CUSTOM PROCEDURE TRAY) ×2 IMPLANT
PAD ARMBOARD 7.5X6 YLW CONV (MISCELLANEOUS) ×4 IMPLANT
PENCIL BUTTON HOLSTER BLD 10FT (ELECTRODE) ×2 IMPLANT
RETAINER VISCERA MED (MISCELLANEOUS) ×2 IMPLANT
SPECIMEN JAR MEDIUM (MISCELLANEOUS) ×2 IMPLANT
SPONGE SURGIFOAM ABS GEL 100 (HEMOSTASIS) ×4 IMPLANT
STAPLER VISISTAT 35W (STAPLE) ×4 IMPLANT
SUT ETHIBOND 5 LR DA (SUTURE) IMPLANT
SUT MNCRL AB 4-0 PS2 18 (SUTURE) ×6 IMPLANT
SUT PDS AB 1 TP1 96 (SUTURE) ×4 IMPLANT
SUT PROLENE 2 0 MH 48 (SUTURE) IMPLANT
SUT PROLENE 3 0 SH 48 (SUTURE) ×4 IMPLANT
SUT PROLENE 3 0 SH DA (SUTURE) ×6 IMPLANT
SUT PROLENE 3 0 SH1 36 (SUTURE) IMPLANT
SUT PROLENE 5 0 C 1 24 (SUTURE) IMPLANT
SUT PROLENE 5 0 C 1 36 (SUTURE) IMPLANT
SUT PROLENE 5 0 CC 1 (SUTURE) ×4 IMPLANT
SUT PROLENE 6 0 C 1 24 (SUTURE) ×10 IMPLANT
SUT SILK 2 0 SH CR/8 (SUTURE) ×2 IMPLANT
SUT SILK 2 0 TIES 17X18 (SUTURE) ×2
SUT SILK 2-0 18XBRD TIE BLK (SUTURE) ×1 IMPLANT
SUT SILK 3 0 TIES 17X18 (SUTURE) ×2
SUT SILK 3-0 18XBRD TIE BLK (SUTURE) ×1 IMPLANT
SUT VIC AB 2-0 CT1 36 (SUTURE) ×4 IMPLANT
SUT VIC AB 3-0 SH 27 (SUTURE) ×2
SUT VIC AB 3-0 SH 27X BRD (SUTURE) ×2 IMPLANT
TOWEL BLUE STERILE X RAY DET (MISCELLANEOUS) ×4 IMPLANT
TOWEL OR 17X24 6PK STRL BLUE (TOWEL DISPOSABLE) ×4 IMPLANT
TOWEL OR 17X26 10 PK STRL BLUE (TOWEL DISPOSABLE) ×6 IMPLANT
TRAY FOLEY CATH 14FRSI W/METER (CATHETERS) ×2 IMPLANT
WATER STERILE IRR 1000ML POUR (IV SOLUTION) ×4 IMPLANT

## 2011-11-10 NOTE — Anesthesia Postprocedure Evaluation (Signed)
  Anesthesia Post-op Note  Patient: [name]  Procedure(s) Performed:  AORTA BIFEMORAL BYPASS GRAFT  Patient Location: PACU  Anesthesia Type: General  Level of Consciousness: awake and alert   Airway and Oxygen Therapy: Patient Spontanous Breathing and Patient connected to face mask oxygen  Post-op Pain: mild  Post-op Assessment: Post-op Vital signs reviewed, Patient's Cardiovascular Status Stable, Respiratory Function Stable, Patent Airway, No signs of Nausea or vomiting and Pain level controlled  Post-op Vital Signs: Reviewed and stable  Complications: No apparent anesthesia complications

## 2011-11-10 NOTE — Brief Op Note (Signed)
BRIEF OPERATIVE NOTE   PROCEDURE: 1.  Bilateral femoral exposure in reoperative field 2.  Aortobifemoral bypass graft  PRE-OPERATIVE DIAGNOSIS: Bilateral intermittent claudication, Severe right external iliac artery stenosis  POST-OPERATIVE DIAGNOSIS: same as above   SURGEON: Leonides Sake, MD  ASSISTANT(S): Josephina Gip, MD, Autumn Patty, [facility]  ANESTHESIA: general  ESTIMATED BLOOD LOSS: 600 cc  FLUIDS: 4 L  UOP: 1 L  FINDING(S): 1.  Heavily calcified right iliac arterial system 2.  Widely patent bilateral profunda arteries 3.  Dopplerable bilateral dorsalis pedis arteries and posterior tibial arteries at end of case  SPECIMEN(S):  none  COMPLICATIONS: none  CONDITION: stable   Leonides Sake, MD [facility] [place] [place] Office: [phone] Pager: [phone]  11/10/2011, 3:13 PM

## 2011-11-10 NOTE — Transfer of Care (Signed)
Immediate Anesthesia Transfer of Care Note  Patient: [name]  Procedure(s) Performed:  AORTA BIFEMORAL BYPASS GRAFT  Patient Location: PACU  Anesthesia Type: General  Level of Consciousness: awake, alert  and oriented  Airway & Oxygen Therapy: Patient Spontanous Breathing and Patient connected to face mask oxygen  Post-op Assessment: Report given to PACU RN, Post -op Vital signs reviewed and stable and Patient moving all extremities X 4  Post vital signs: Reviewed and stable  Complications: No apparent anesthesia complications

## 2011-11-10 NOTE — Progress Notes (Signed)
MEDICATION RELATED CONSULT NOTE - INITIAL   Pharmacy Consult for Adjustment antibiotics for renal function Indication: post op prophylaxis  Allergies  Allergen Reactions  . Ciprofloxacin Hcl Shortness Of Breath and Rash    fever  . Vancomycin Shortness Of Breath and Rash    fever  . Heparin Other (See Comments)    Blood-clots and bleeding.    Patient Measurements: Weight: 178 lb 9.2 oz (81 kg)   Vital Signs: Temp: 98 F (36.7 C) (01/23 1721) Temp src: Oral (01/23 0720) BP: 104/61 mmHg (01/23 1730) Pulse Rate: 92  (01/23 1730) Intake/Output from previous day:   Intake/Output from this shift: Total I/O In: 4700 [I.V.:3700; IV Piggyback:1000] Out: 1770 [Urine:1170; Blood:600]  Labs:  Basename 11/10/11 1600 11/10/11 1435 11/10/11 1304 11/10/11 0705  WBC 13.1* -- -- 13.1*  HGB 10.8* 10.2* 10.2* --  HCT 31.8* 30.0* 30.0* --  PLT 180 -- -- 226  APTT 53* -- -- 33  CREATININE 0.82 -- -- 0.95  LABCREA -- -- -- --  CREATININE 0.82 -- -- 0.95  CREAT24HRUR -- -- -- --  MG 1.4* -- -- --  PHOS -- -- -- --  ALBUMIN -- -- -- 3.8  PROT -- -- -- 7.1  ALBUMIN -- -- -- 3.8  AST -- -- -- 15  ALT -- -- -- 22  ALKPHOS -- -- -- 69  BILITOT -- -- -- 0.5  BILIDIR -- -- -- --  IBILI -- -- -- --   The CrCl is unknown because both a height and weight (above a minimum accepted value) are required for this calculation.   Microbiology: Recent Results (from the past 720 hour(s))  SURGICAL PCR SCREEN     Status: Abnormal   Collection Time   11/02/11  9:59 AM      Component Value Range Status Comment   MRSA, PCR POSITIVE (*) NEGATIVE  Final    Staphylococcus aureus POSITIVE (*) NEGATIVE  Final     Medical History: Past Medical History  Diagnosis Date  . Clotting disorder   . Hyperlipidemia   . Degenerative disc disease   . Peripheral arterial disease   . Leg fracture, left   . Leg fracture, right   . PVD (peripheral vascular disease)   . COPD (chronic obstructive pulmonary  disease)   . Anxiety   . Depression     Medications:  Prescriptions prior to admission  Medication Sig Dispense Refill  . albuterol (PROVENTIL HFA;VENTOLIN HFA) 108 (90 BASE) MCG/ACT inhaler Inhale 2 puffs into the lungs every 6 (six) hours as needed. For shortness of breath.       Marland Kitchen atorvastatin (LIPITOR) 40 MG tablet Take 40 mg by mouth daily.        . budesonide-formoterol (SYMBICORT) 160-4.5 MCG/ACT inhaler Inhale 2 puffs into the lungs 2 (two) times daily.        . Calcium-Vitamin D-Vitamin K [id number] MG-UNT-MCG CHEW Chew 2 tablets by mouth daily.        Marland Kitchen CIALIS 20 MG tablet Take 20 mg by mouth daily as needed. For erectile dysfunction.      . ferrous fumarate (HEMOCYTE - 106 MG FE) 325 (106 FE) MG TABS Take 1 tablet by mouth daily.        . sertraline (ZOLOFT) 50 MG tablet Take 50 mg by mouth daily.        Marland Kitchen tiotropium (SPIRIVA) 18 MCG inhalation capsule Place 18 mcg into inhaler and inhale daily.        Marland Kitchen  traZODone (DESYREL) 50 MG tablet Take 25 mg by mouth at bedtime. 1/2 tablet at night      . warfarin (COUMADIN) 1 MG tablet Take 2 mg by mouth 2 (two) times a week. Monday and Wednesday.      . warfarin (COUMADIN) 3 MG tablet Take 3 mg by mouth See admin instructions. Sunday, Tuesday, Thursday, Friday and saturday        Assessment: 66 yo s/p aorta bifemoral bypass graft to receive cefuroxime post op prophylaxis, CrCl ~90 ml/min  Goal of Therapy:  Appropriate antibiotic therapy  Plan: Cefuroxime as ordered.  Talbert Cage Poteet 11/10/2011,6:31 PM

## 2011-11-10 NOTE — Op Note (Signed)
PROCEDURE:  1. Bilateral femoral exposure in reoperative field  2. Aortobifemoral bypass graft   PRE-OPERATIVE DIAGNOSIS: Bilateral intermittent claudication, Severe right external iliac artery stenosis   POST-OPERATIVE DIAGNOSIS: same as above   SURGEON: Leonides Sake, MD   ASSISTANT(S): Josephina Gip, MD; Autumn Patty, [facility]   ANESTHESIA: general   ESTIMATED BLOOD LOSS: 600 cc   FLUIDS: 4 L   UOP: 1 L   FINDING(S):  1. Heavily calcified right iliac arterial system  2. Widely patent bilateral profunda arteries  3. Dopplerable bilateral dorsalis pedis arteries and posterior tibial arteries at end of case   SPECIMEN(S): none   INDICATION: [name] is a 66 y.o. male with short distance claudication.  He has a complicated re-vascularization history that has been difficult due to thrombophilia discovered intraoperatively.  Since his last operation, he has developed right external iliac artery stenosis that is severe, reducing luminal diameter to 2 mm in the right external iliac artery.  As this is the inflow for his right to left femorofemoral bypass, I felt that intervention was necessary before the patient developed acute ischemia.  The patient is aware the risks of aortic surgery include but are not limited to: bleeding, need for transfusion, infection, death, stroke, paralysis, wound complications, bowel injuries, impotence, bowel ischemia, extended ventilation and future ventral hernias.  Overall, I cited a mortality rate of 5-10% and morbidity rate of 30%.  DESCRIPTION OF OPERATION: After full informed written consent was obtained from the patient, he was brought back to the operating room, and placed supine upon the operating table. He was given IV antibiotics prior to proceeding. After obtaining adequate anesthesia, he was prepped and draped in standard fashion for aortobifemoral procedure. We turned our attention to both groins. Dr. Hart Rochester dissected out the left common  femoral artery from the proximal common femoral artery down to the femoral bifurcation. There was severe scarring through this twice operated upon groin.  He dissected out the profunda branches and the left side of the fem-fem graft and had good control of these vessels.  There remained a pulse in the fem-fem graft.  On the right side, I made a longitudinal incision over the previous incision.  I dissected through the intense scar in this twice operated upon groin down to the superficial femoral artery.  Once I visualized the superficial femoral artery, I dissected proximally to identify the common femoral artery with the right side of the fem-fem graft anastomosed to the artery.  There was no evidence of infection this patient, as evident by the intense scarring and lack of fluid collection.  I carried this dissection up to the proximal common femoral artery and skeletonized out the entire right side of the fem-fem graft and the profunda branches.  The previously fragile external iliac artery appeared to be intact without evident of the same fragility as during his previous operation.  On both sides, a tunnel underneath the inguinal ligament was developed.  Wet laps pads were placed into each groin to keep the tissue moist.  At this point, I turned my attention to the abdomen. I made a midline incision from the xiphoid down to the pubis.  Using electrocautery, I dissected through the subcutaneous tissue down to the fascia and then I opened up the fascia in a small area where I encountered preperitoneal fat. This was then elevated and then I opened a window in the preperitoneal fat gaining access to the peritoneum. I opened up the peritoneum carefully avoiding any bowels  and then extended this incision through the fascia and peritoneum from the level of the xiphoid down to the level of the suprapubic incision. In such fashion we completely opened this patient's abdomen. I placed a Engineer, manufacturing. Then I ran the  bowel from the ligament of Treitz all the way down to the sigmoid colon. There were no obvious lesions in the bowels. At this point, the Omni retractor was placed and used to help provide exposure and retraction. I elevated the transverse colon, and placed it into a wet towel, removing this portion of the colon out of the abdomen. The small bowels were then placed into a wet towel and eviscerated to gain exposure of the retroperitoneum. I then dissected free the duodenum, mobilizing it out of the way and then I opened up the retroperitoneum over the aorta. This was dissected up to the level of the renal vein all the way down to the aortic bifurcation. Note that this aorta was calcified in a few areas, consistent with his prior aortogram.  At this point, I dissected out the aorta roughly at the level of the renal arteries until about 8 cm distal to the renal arteries.  In this process, two lumbar arteries were identified, ligated, and transected.  I carried the dissection distally until I had uncovered the aortic bifurcation.  I developed a tissue plane just anterior to the right iliac arterial system.  Note, the right common and external iliac arteries were quite calcified.  I was able complete the tunnel from the right groin to this plane just anterior to the common iliac artery.  I placed an umbilical tape with a ring forcep in this tunnel.   In a similar fashion, a tunnel was developed on the left side and an umbilical tape was placed.  At this point, I had Anesthesia give the patient 21 g of Mannitol and bolus the patient 60 mg of Angiomax and start the drip.  After waiting 5 minutes, an ACT was drawn which was found to be fully therapeutic.  The aorta was clamped in the infrarenal position with a transverse Harken aortic clamp and approximately 6 cm distally with Debakey aortic clamp.  I transected the aorta 2 cm distal to the proximal clamp.  I then transected the aorta about 2 cm proximal to the distal clamp,  at a beveled angle which was parallel to the clamp.  The distal aorta was oversewn in a double layer with 3-0 Prolene.  The distal aortic clamp was removed.  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  I performed a limited endarterectomy with a Cytogeneticist until the remaining tissue was densely adherent.  I obtained a 14 mm x 8 mm bifurcated dacron graft.  The tube portion was shortened sharply.  I sewed the graft to the aorta with a running stitch of 3-0 Prolene.  Prior to completing the anastomosis, I pulled up on the suture line with a nerve hook.  The anastomosis was completed in the usual fashion.  I tested the proximal anastomosis and found a couple of bleeding points.  One area had to repaired with a pledgetted mattress stitch.  Another was repaired with a figure of eight stitch.  At this point, there was no further bleeding from the aortic anastomosis.  I allowed the aortic graft to decompress by releasing the cross clamp.  No clot was identified.  I then clamped each aortobifemoral limb separately  with Fogarty graft clamps and then each graft was then tied to its respective umbilical tape. The right going to the right, the left going to the left, and then they were delivered through the tunnel behind the ureter on both sides without any difficulties. Each limb was then pulled to appropriate length and tension. I clamped off the fem-fem graft in the right groin.  The left profunda branches were then clamped as was the proximal common femoral artery.  The left side of fem-fem graft was taken down sharply.  Dr. Hart Rochester then cut the graft anteriorly until we had visualization of the profunda artery orifice.  It was widely open and easily accommodated the tip of a right angle clamp.  Dr. Hart Rochester released the profunda clamp and excellent bleeding was obtained.  The Dacron graft anastomosed to the artery was intact and well incorporated.  Dr.  Hart Rochester cut off residual graft off the artery to facilitate an end-to-end anastomosis.  The left aortobifemoral limb was then pulled to approximate tension and length and spatulated.  The graft was sewn to the old graft on the common femoral artery in an end-to-end configuration.  Prior to completing the anastomosis, the aortobifemoral limb was allowed to bleed in an antegrade fashion, and excellent bleeding was obtained.  The profunda artery was also allowed to backbleeding, and excellent bleeding was obtained.  The anastomosis was completed in the usual fashion.  At the same time on the right, the right profunda branches were clamped as was the proximal common femoral artery.  The right side of fem-fem graft was taken down sharply.  I then cut the graft anteriorly until we had visualization of the profunda artery orifice.  It was widely open and easily accommodated the tip of a right angle clamp.  I released the profunda clamp and excellent bleeding was obtained.  The Dacron graft anastomosed to the artery was intact and well incorporated.  I cut off residual graft off the artery to facilitate an end-to-end anastomosis.  The right aortobifemoral limb was then pulled to approximate tension and length and spatulated.  The graft was sewn to the old graft on the common femoral artery in an end-to-end configuration.  Prior to completing the anastomosis, the aortobifemoral limb was allowed to bleed in an antegrade fashion, and excellent bleeding was obtained.  The profunda artery was also allowed to backbleeding, and excellent bleeding was obtained.  The anastomosis was completed in the usual fashion.  At this point, the Angiomax drip was discontinued.  We released the clamp on the left aortobifemoral limb first.  Once Anesthesia stabilized the blood pressure, the right clamp was released.  There was a pulse in both profunda arteries at this point.  I then broke scrub and inspected both feet.  Both were warm and pink.   There were dopplerable dorsalis pedis and posterior tibialis arteries in both leg.   For the next hour, I explored all surgical wounds controlling bleeding with electrocautery and suture repair of bleeding points with 6-0 Prolene.  The ACT after this hour was <200 seconds.  I packed thrombin and gelfoam in all surgical wounds multiple times.  Eventually, the bleeding in both groins was controlled and both were washed out, without evidence of significant arterial bleeding.  I placed SurgiSeal into each groin.  Each groin was repaired with a double layer of 2-0 Vicryl, a double layer of 3-0 Vicryl, and a subcuticular layer of 4-0 Monocryl.  The skin in the groins was cleaned, dried,  and reinforced with Dermabond.  The bleeding in the abdomen was well controlled, with only a minor amount of venous bleeding was present, which I felt was best addressed by closing the retroperitoneum.  The retroperitoneum was reapproximated with a running stitch of 3-0 Vicryl.  I reinspected the bowel, identifying no areas of bowel injury.  Also no residual lap pads were found the in abdomen.  I replaced the bowels into their anatomic position.  The fascia was then repaired with two running stitches of double stranded 1 PDS running from above and below, tying in the middle.  The abdominal skin was then reappproximated with staples.  The skin was cleaned, dried, and bandaged with coverall bandages.  The sponge, needle, and instrument count was correct.  COMPLICATIONS: none   CONDITION: stable   Leonides Sake, MD [facility] [place] Office: [phone] Pager: [phone]  11/10/2011, 8:07 PM

## 2011-11-10 NOTE — Anesthesia Procedure Notes (Signed)
Procedure Name: Intubation Date/Time: 11/10/2011 8:44 AM Performed by: Rosita Fire Pre-anesthesia Checklist: Patient identified, Emergency Drugs available, Suction available, Patient being monitored and Timeout performed Patient Re-evaluated:Patient Re-evaluated prior to inductionOxygen Delivery Method: Circle System Utilized Preoxygenation: Pre-oxygenation with 100% oxygen Intubation Type: IV induction Ventilation: Mask ventilation without difficulty Laryngoscope Size: Miller and 2 Grade View: Grade I Tube type: Oral Tube size: 8.0 mm Number of attempts: 1 Airway Equipment and Method: stylet and oral airway Placement Confirmation: ETT inserted through vocal cords under direct vision,  positive ETCO2 and breath sounds checked- equal and bilateral Secured at: 23 cm Tube secured with: Tape Dental Injury: Teeth and Oropharynx as per pre-operative assessment

## 2011-11-10 NOTE — H&P (Signed)
[facility] [place]  History and Physical  History of Present Illness  [name] is a 66 y.o. male who presents with chief complaint: BLE intermittent claudication.  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  Due to his severe COPD, we elected to try to treat his disease with a R to L fem-fem with L CFA to AK pop bypass with vein.  This was complicated with thrombosis shortly after his procedure was completed.  He then underwent thormbectomy of fem-fem and a L CFA to BK pop bypass with Propaten.  Unfortunately, the patient was found to have a lupus anticoagulant and HITT.  These graft eventually clotted.  He then underwent a redo R to L fem-fem bypass, which was very difficult.  The right common femoral artery was very friable and required patch angioplasty.  The patient has been stable in regards to his intermittent claudication, but on recent arterial duplex concerns of stenosis were raised.  He underwent angiography which demonstrated proximal common femoral artery/distal external iliac artery severe stenosis with a flow lumen of only 2 mm.  Unfortunately, at this point, I feel he will need Aortobifemoral bypass to deal with his left iliac disease and prophylactic deal with his right iliac disease.  He has been cleared by his cardiologist.  His pulmonologist notes the concerns with his severe COPD: extended ventilation is a possibility.  Past Medical History  Diagnosis Date  . Clotting disorder   . Hyperlipidemia   . Degenerative disc disease   . Peripheral arterial disease   . Leg fracture, left   . Leg fracture, right   . PVD (peripheral vascular disease)   . COPD (chronic obstructive pulmonary disease)   . Anxiety   . Depression     Past Surgical History  Procedure Date  . Redo r to l femoral-femoral bypass 10/27/10  . Iliofemoral endarterectomy with bovine patch angioplasty 10/27/10  . Thombectomy and fem pop bypass 08/17/10   . Aotogram  07/30/10  . Neck surgery   . Cardiovascular stress test 08/03/2010    EF 53%. NO ISCHEMIA  . Fracture surgery     pins left femur    History   Social History  . Marital Status: Married    Spouse Name: N/A    Number of Children: N/A  . Years of Education: N/A   Occupational History  . Not on file.   Social History Main Topics  . Smoking status: Former Smoker -- 1.5 packs/day    Quit date: 08/13/2010  . Smokeless tobacco: Not on file  . Alcohol Use: No  . Drug Use: No  . Sexually Active:    Other Topics Concern  . Not on file   Social History Narrative  . No narrative on file    Family History  Problem Relation Age of Onset  . Cancer Mother   . Emphysema Mother   . Lung cancer Mother     No current facility-administered medications on file prior to encounter.   Current Outpatient Prescriptions on File Prior to Encounter  Medication Sig Dispense Refill  . atorvastatin (LIPITOR) 40 MG tablet Take 40 mg by mouth daily.        . budesonide-formoterol (SYMBICORT) 160-4.5 MCG/ACT inhaler Inhale 2 puffs into the lungs 2 (two) times daily.        Marland Kitchen CIALIS 20 MG tablet Take 20 mg by mouth daily as needed. For erectile dysfunction.      Marland Kitchen  sertraline (ZOLOFT) 50 MG tablet Take 50 mg by mouth daily.        Marland Kitchen tiotropium (SPIRIVA) 18 MCG inhalation capsule Place 18 mcg into inhaler and inhale daily.        . traZODone (DESYREL) 50 MG tablet Take 25 mg by mouth at bedtime. 1/2 tablet at night      . warfarin (COUMADIN) 1 MG tablet Take 2 mg by mouth 2 (two) times a week. Monday and Wednesday.      . warfarin (COUMADIN) 3 MG tablet Take 3 mg by mouth See admin instructions. Sunday, Tuesday, Thursday, Friday and saturday        Allergies  Allergen Reactions  . Heparin Other (See Comments)    Blood-clots and bleeding.   Review of Systems (Positive items checked otherwise negative)  General: [ ]  Weight loss, [ ]  Weight gain, [ ]   Loss of appetite, [ ]  Fever  Neurologic: [ ]   Dizziness, [ ]  Blackouts, [ ]  Headaches, [ ]  Seizure  Ear/Nose/Throat: [ ]  Change in eyesight, [ ]  Change in hearing, [ ]  Nose bleeds, [ ]  Sore throat  Vascular: [x]  Pain in legs with walking, [ ]  Pain in feet while lying flat, [ ]  Non-healing ulcer, [ ]  Stroke, [ ]  "Mini stroke", [ ]  Slurred speech, [ ]  Temporary blindness, [ ]  Blood clot in vein, [ ]  Phlebitis  Pulmonary: [ ]  Home oxygen, [ ]  Productive cough, [ ]  Bronchitis, [ ]  Coughing up blood, [ ]  Asthma, [ ]  Wheezing, [x]  severe COPD  Musculoskeletal: [ ]  Arthritis, [ ]  Joint pain, [ ]  Muscle pain  Cardiac: [ ]  Chest pain, [ ]  Chest tightness/pressure, [ ]  Shortness of breath when lying flat, [ ]  Shortness of breath with exertion, [ ]  Palpitations, [ ]  Heart murmur, [ ]  Arrythmia,  [ ]  Atrial fibrillation  Hematologic: [ ]  Bleeding problems, [ ]  Clotting disorder, [ ]  Anemia, [x]  thrombophilia: lupus anticoagulant, HITT  Psychiatric:  [ ]  Depression, [ ]  Anxiety, [ ]  Attention deficit disorder  Gastrointestinal:  [ ]  Black stool,[ ]   Blood in stool, [ ]  Peptic ulcer disease, [ ]  Reflux, [ ]  Hiatal hernia, [ ]  Trouble swallowing, [ ]  Diarrhea, [ ]  Constipation  Urinary:  [ ]  Kidney disease, [ ]  Burning with urination, [ ]  Frequent urination, [ ]  Difficulty urinating  Skin: [ ]  Ulcers, [ ]  Rashes  Physical Examination  Filed Vitals:   11/10/11 0720  BP: 150/78  Pulse: 70  Temp: 98 F (36.7 C)  TempSrc: Oral  Resp: 18  SpO2: 94%   General: A&O x 3, WDWN  Head: [place]/AT  Ear/Nose/Throat: Hearing grossly intact, nares w/o erythema or drainage, oropharynx w/o Erythema/Exudate  Eyes: PERRLA, EOMI  Neck: Supple, no nuchal rigidity, no palpable LAD  Pulmonary: Sym exp, good air movt, CTAB, no rales, rhonchi, & wheezing  Cardiac: RRR, Nl S1, S2, no Murmurs, rubs or gallops  Vascular: Vessel Right Left  Radial Palpable Palpable  Brachial Palpable Palpable  Carotid Palpable, without bruit Palpable, without bruit  Aorta  Non-palpable N/A  Femoral Palpable Palpable  Popliteal Non-palpable Non-palpable  PT Non-Palpable Non-Palpable  DP Non-Palpable Non-Palpable   Gastrointestinal: soft, NTND, -G/R, - HSM, - masses, - CVAT B, palpable fem-fem pulse  Musculoskeletal: M/S 5/5 throughout , Extremities without ischemic changes   Neurologic: CN 2-12 intact , Pain and light touch intact in extremities , Motor exam as listed above  Psychiatric: Judgment intact, Mood & affect appropriate for pt's  clinical situation  Dermatologic: See M/S exam for extremity exam, no rashes otherwise noted  Lymph : No Cervical, Axillary, or Inguinal lymphadenopathy   Medical Decision Making  [name] is a 66 y.o. male who presents with: right iliac artery stenoses, left iliac artery occlusion, sx BLE intermittent claudication.   The patient is scheduled for: Aortobifemoral bypass  The patient is aware the risks of aortic surgery include but are not limited to: bleeding, need for transfusion, infection, death, stroke, paralysis, wound complications, bowel injuries, impotence, bowel ischemia, extended ventilation and future ventral hernias.    Overall, I cited a mortality rate of 5-10% and morbidity rate of 30%.  The patient agrees to proceed.  Dr. Hart Rochester will be assisting me with this procedure.  Leonides Sake, MD [facility] [place] Office: [phone] Pager: [phone]  11/10/2011, 6:48 AM

## 2011-11-10 NOTE — Preoperative (Signed)
Beta Blockers   Reason not to administer Beta Blockers:Not Applicable 

## 2011-11-10 NOTE — Anesthesia Preprocedure Evaluation (Addendum)
Anesthesia Evaluation  Patient identified by MRN, date of birth, ID band Patient awake    Reviewed: Allergy & Precautions, H&P , NPO status , Patient's Chart, lab work & pertinent test results  Airway Mallampati: II TM Distance: >3 FB Neck ROM: Full    Dental  (+) Edentulous Upper   Pulmonary COPD COPD inhaler,  clear to auscultation        Cardiovascular + Peripheral Vascular Disease Regular Normal    Neuro/Psych PSYCHIATRIC DISORDERS Anxiety Depression    GI/Hepatic   Endo/Other    Renal/GU      Musculoskeletal   Abdominal   Peds  Hematology   Anesthesia Other Findings   Reproductive/Obstetrics                          Anesthesia Physical Anesthesia Plan  ASA: III  Anesthesia Plan: General   Post-op Pain Management:    Induction: Intravenous  Airway Management Planned: Oral ETT  Additional Equipment: Arterial line, PA Cath, Ultrasound Guidance Line Placement and CVP  Intra-op Plan:   Post-operative Plan: Extubation in OR  Informed Consent: I have reviewed the patients History and Physical, chart, labs and discussed the procedure including the risks, benefits and alternatives for the proposed anesthesia with the patient or authorized representative who has indicated his/her understanding and acceptance.   Dental advisory given  Plan Discussed with: CRNA, Anesthesiologist and Surgeon  Anesthesia Plan Comments:         Anesthesia Quick Evaluation

## 2011-11-11 ENCOUNTER — Inpatient Hospital Stay (HOSPITAL_COMMUNITY): Payer: BC Managed Care – PPO

## 2011-11-11 ENCOUNTER — Encounter (HOSPITAL_COMMUNITY): Payer: Self-pay | Admitting: Vascular Surgery

## 2011-11-11 DIAGNOSIS — R0902 Hypoxemia: Secondary | ICD-10-CM

## 2011-11-11 DIAGNOSIS — I708 Atherosclerosis of other arteries: Secondary | ICD-10-CM

## 2011-11-11 DIAGNOSIS — J449 Chronic obstructive pulmonary disease, unspecified: Secondary | ICD-10-CM

## 2011-11-11 LAB — CARDIAC PANEL(CRET KIN+CKTOT+MB+TROPI)
CK, MB: 3.5 ng/mL (ref 0.3–4.0)
Relative Index: 0.6 (ref 0.0–2.5)
Total CK: 541 U/L — ABNORMAL HIGH (ref 7–232)
Troponin I: <0.3 ng/mL (ref <0.30)

## 2011-11-11 LAB — SURGICAL PCR SCREEN: Staphylococcus aureus: NEGATIVE

## 2011-11-11 LAB — CBC
HCT: 30 % — ABNORMAL LOW (ref 39.0–52.0)
Hemoglobin: 10.1 g/dL — ABNORMAL LOW (ref 13.0–17.0)
MCHC: 33.7 g/dL (ref 30.0–36.0)
RBC: 3.26 MIL/uL — ABNORMAL LOW (ref 4.22–5.81)

## 2011-11-11 LAB — POCT I-STAT 3, ART BLOOD GAS (G3+)
Bicarbonate: 19.9 mEq/L — ABNORMAL LOW (ref 20.0–24.0)
O2 Saturation: 88 %
Patient temperature: 99.8
TCO2: 21 mmol/L (ref 0–100)
pCO2 arterial: 36.8 mmHg (ref 35.0–45.0)

## 2011-11-11 LAB — AMYLASE: Amylase: 95 U/L (ref 0–105)

## 2011-11-11 LAB — BASIC METABOLIC PANEL
BUN: 13 mg/dL (ref 6–23)
CO2: 24 mEq/L (ref 19–32)
GFR calc non Af Amer: >90 mL/min (ref >90)
Glucose, Bld: 148 mg/dL — ABNORMAL HIGH (ref 70–99)
Potassium: 3.9 mEq/L (ref 3.5–5.1)
Sodium: 139 mEq/L (ref 135–145)

## 2011-11-11 MED ORDER — WARFARIN SODIUM 5 MG PO TABS
5.0000 mg | ORAL_TABLET | Freq: Once | ORAL | Status: AC
  Administered 2011-11-11: 5 mg via ORAL
  Filled 2011-11-11: qty 1

## 2011-11-11 MED ORDER — ALBUTEROL SULFATE (5 MG/ML) 0.5% IN NEBU
2.5000 mg | INHALATION_SOLUTION | RESPIRATORY_TRACT | Status: DC | PRN
  Filled 2011-11-11: qty 0.5

## 2011-11-11 MED ORDER — FONDAPARINUX SODIUM 7.5 MG/0.6ML ~~LOC~~ SOLN
7.5000 mg | SUBCUTANEOUS | Status: DC
  Administered 2011-11-12 – 2011-11-13 (×2): 7.5 mg via SUBCUTANEOUS
  Filled 2011-11-11 (×3): qty 0.6

## 2011-11-11 MED ORDER — TIOTROPIUM BROMIDE MONOHYDRATE 18 MCG IN CAPS: 18.0000 ug | ORAL_CAPSULE | Freq: Every day | RESPIRATORY_TRACT | Status: DC

## 2011-11-11 MED ORDER — NALOXONE HCL 0.4 MG/ML IJ SOLN: 0.4000 mg | INTRAMUSCULAR | Status: DC | PRN

## 2011-11-11 MED ORDER — SODIUM CHLORIDE 0.9 % IJ SOLN
9.0000 mL | INTRAMUSCULAR | Status: DC | PRN
  Administered 2011-11-11: 10 mL via INTRAVENOUS

## 2011-11-11 MED ORDER — MORPHINE SULFATE (PF) 1 MG/ML IV SOLN
INTRAVENOUS | Status: DC
  Filled 2011-11-11: qty 25

## 2011-11-11 MED ORDER — ALBUTEROL SULFATE (5 MG/ML) 0.5% IN NEBU
2.5000 mg | INHALATION_SOLUTION | Freq: Four times a day (QID) | RESPIRATORY_TRACT | Status: DC
  Administered 2011-11-11 – 2011-11-14 (×12): 2.5 mg via RESPIRATORY_TRACT
  Filled 2011-11-11 (×12): qty 0.5

## 2011-11-11 MED ORDER — IPRATROPIUM BROMIDE 0.02 % IN SOLN
0.5000 mg | Freq: Four times a day (QID) | RESPIRATORY_TRACT | Status: DC
  Administered 2011-11-11 – 2011-11-14 (×12): 0.5 mg via RESPIRATORY_TRACT
  Filled 2011-11-11 (×11): qty 2.5

## 2011-11-11 MED ORDER — FONDAPARINUX SODIUM 2.5 MG/0.5ML ~~LOC~~ SOLN
2.5000 mg | SUBCUTANEOUS | Status: DC
  Administered 2011-11-11: 2.5 mg via SUBCUTANEOUS
  Filled 2011-11-11: qty 0.5

## 2011-11-11 MED ORDER — DIPHENHYDRAMINE HCL 12.5 MG/5ML PO ELIX
12.5000 mg | ORAL_SOLUTION | Freq: Four times a day (QID) | ORAL | Status: DC | PRN
  Filled 2011-11-11: qty 5

## 2011-11-11 MED ORDER — DIPHENHYDRAMINE HCL 50 MG/ML IJ SOLN: 12.5000 mg | Freq: Four times a day (QID) | INTRAMUSCULAR | Status: DC | PRN

## 2011-11-11 NOTE — Progress Notes (Signed)
Nursing Daily Progress:  Throughout the day today patient has shown improvement in his mobility status although complains of pain frequently.  Respiratory status remains unchanged from this morning yet he states he feels better.  Patient has been up to chair now for 2 hours and tolerated mobility/tranfer with minimal assistance.  Pain, mobility and oxygen management have been the focus of today's progression and nursing care.  Will pass on this information to physician and oncoming nursing staff.

## 2011-11-11 NOTE — Progress Notes (Signed)
Pt resting in bed.  Desaturated into 70s.  Oxygen increased to 6 liters [place].  Sats remained around 80.  Placed on venturi mask, 50%.  Sats increased to 88% after about 5 minutes, 90% after 20 minutes.  Will continue to monitor.

## 2011-11-11 NOTE — Evaluation (Signed)
Occupational Therapy Evaluation Patient Details Name: [name] MRN: [id number] DOB: 08-Mar-1946 Today's Date: 11/11/2011  Problem List:  Patient Active Problem List  Diagnoses  . Intermittent claudication  . Preop cardiovascular exam  . Peripheral vascular disease, unspecified    Past Medical History:  Past Medical History  Diagnosis Date  . Clotting disorder   . Hyperlipidemia   . Degenerative disc disease   . Peripheral arterial disease   . Leg fracture, left   . Leg fracture, right   . PVD (peripheral vascular disease)   . COPD (chronic obstructive pulmonary disease)   . Anxiety   . Depression    Past Surgical History:  Past Surgical History  Procedure Date  . Redo r to l femoral-femoral bypass 10/27/10  . Iliofemoral endarterectomy with bovine patch angioplasty 10/27/10  . Thombectomy and fem pop bypass 08/17/10   . Aotogram 07/30/10  . Neck surgery   . Cardiovascular stress test 08/03/2010    EF 53%. NO ISCHEMIA  . Fracture surgery     pins left femur  . Aorta - bilateral femoral artery bypass graft 11/10/2011    Procedure: AORTA BIFEMORAL BYPASS GRAFT;  Surgeon: Nilda Simmer, MD;  Location: [facility] OR;  Service: Vascular;  Laterality: N/A;    OT Assessment/Plan/Recommendation OT Assessment Clinical Impression Statement: Pt admitted for aorta bifemoral bypass graft complicated by hypoxia and SOB after surgery. Patient presents with below problem list and will benefit from skilled OT in the acute setting to maximize I with ADL and ADL mobility to facilitate safe d/c home OT Recommendation/Assessment: Patient will need skilled OT in the acute care venue OT Problem List: Decreased activity tolerance;Impaired balance (sitting and/or standing);Decreased knowledge of use of DME or AE;Decreased knowledge of precautions;Cardiopulmonary status limiting activity;Pain OT Therapy Diagnosis : Generalized weakness;Acute pain OT Plan OT Frequency: Min 2X/week OT  Treatment/Interventions: Self-care/ADL training;Therapeutic exercise;DME and/or AE instruction;Therapeutic activities;Patient/family education OT Recommendation Follow Up Recommendations: No OT follow up Equipment Recommended:  (TBD) Individuals Consulted Consulted and Agree with Results and Recommendations: Patient OT Goals Acute Rehab OT Goals OT Goal Formulation: With patient Time For Goal Achievement: 7 days ADL Goals Pt Will Perform Grooming: with supervision;Standing at sink ADL Goal: Grooming - Progress: Goal set today Pt Will Perform Upper Body Bathing: with supervision;Sitting in shower ADL Goal: Upper Body Bathing - Progress: Goal set today Pt Will Perform Lower Body Bathing: with min assist;Sit to stand in shower ADL Goal: Lower Body Bathing - Progress: Goal set today Pt Will Perform Upper Body Dressing: with supervision;Sitting, bed ADL Goal: Upper Body Dressing - Progress: Goal set today Pt Will Perform Lower Body Dressing: with min assist;Sit to stand from bed ADL Goal: Lower Body Dressing - Progress: Goal set today Pt Will Transfer to Toilet: with supervision;Ambulation;with DME;3-in-1 ADL Goal: Toilet Transfer - Progress: Goal set today Pt Will Perform Tub/Shower Transfer: Tub transfer;with min assist;Shower seat without back;Ambulation ADL Goal: Tub/Shower Transfer - Progress: Goal set today  OT Evaluation Precautions/Restrictions  Precautions Required Braces or Orthoses: No Restrictions Weight Bearing Restrictions: No Prior Functioning Home Living Lives With: Spouse;Daughter (daughter is young) Actor Help From: Family Type of Home: House Home Layout: Two level;Able to live on main level with bedroom/bathroom Alternate Level Stairs-Number of Steps: basement but pt doesn't need to go down there Home Access: Stairs to enter Entrance Stairs-Rails: None Entrance Stairs-Number of Steps: 2 Bathroom Shower/Tub: Health visitor: Standard Home  Adaptive Equipment: Bedside commode/3-in-1;Straight cane Prior Function Level of Independence:  Independent with basic ADLs;Independent with homemaking with ambulation;Independent with gait;Independent with transfers Driving: Yes 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 ADL ADL Eating/Feeding: Not assessed Grooming: Simulated;Minimal assistance;Set up Grooming Details (indicate cue type and reason): Min-Min guard A for sitting balance at EOB Where Assessed - Grooming: Sitting, bed Upper Body Bathing: Simulated;Moderate assistance Upper Body Bathing Details (indicate cue type and reason): limited mainly by pain Where Assessed - Upper Body Bathing: Sitting, bed Lower Body Bathing: Performed;Moderate assistance Where Assessed - Lower Body Bathing: Sit to stand from bed Upper Body Dressing: Not assessed Lower Body Dressing: Simulated;+1 Total assistance Where Assessed - Lower Body Dressing: Sit to stand from bed Toilet Transfer: Simulated;+2 Total assistance Toilet Transfer Details (indicate cue type and reason): pt=70% stand-pivot from EOB to chair Toilet Transfer Method: Stand pivot Toileting - Clothing Manipulation: Not assessed Toileting - Hygiene: Not assessed Tub/Shower Transfer: Not assessed Vision/Perception  Vision - History Baseline Vision: No visual deficits Patient Visual Report: No change from baseline Cognition Cognition Arousal/Alertness: Awake/alert Overall Cognitive Status: Appears within functional limits for tasks assessed Orientation Level: Oriented X4 Sensation/Coordination Sensation Light Touch: Appears Intact Coordination Gross Motor Movements are Fluid and Coordinated: Yes Fine Motor Movements are Fluid and Coordinated: Yes Extremity Assessment RUE Assessment RUE Assessment: Within Functional Limits LUE Assessment LUE Assessment: Within Functional Limits Mobility  Bed Mobility Bed Mobility: Yes Rolling Right: 1: +2 Total  assist Rolling Right Details (indicate cue type and reason): +2totalpt65% with cues for sequencing and assist for follow through with pad  Right Sidelying to Sit: 1: +2 Total assist (40%) Right Sidelying to Sit Details (indicate cue type and reason): +2totalpt40% to bring trunk upright (pt very stiff secondary to pain) Sitting - Scoot to Edge of Bed: 4: Min assist Transfers Sit to Stand: 1: +2 Total assist;From bed;With upper extremity assist (70%) Sit to Stand Details (indicate cue type and reason): facilitation for anterior translation and follow through; pt using RUE to push from bed while holding pillow against his abdomen with LUE to splint for pain Stand to Sit: 4: Min assist;To chair/3-in-1;With armrests;With upper extremity assist Stand to Sit Details: min cues control descent and sequence stand->sit to recliner, RUE reaching for arm rest to guide self into chair Exercises Total Joint Exercises Ankle Circles/Pumps: AROM;10 reps;Supine;Both End of Session OT - End of Session Equipment Utilized During Treatment: Gait belt Activity Tolerance: Patient limited by fatigue;Patient limited by pain Patient left: in chair;with call bell in reach Nurse Communication: Mobility status for transfers;Mobility status for ambulation General Behavior During Session: [facility] for tasks performed Cognition: [facility] for tasks performed   Veola Cafaro 11/11/2011, 4:29 PM

## 2011-11-11 NOTE — Progress Notes (Signed)
ANTICOAGULATION CONSULT NOTE - Initial Consult  Pharmacy Consult for Coumadin Indication: "clotting d/o", PVD  Allergies  Allergen Reactions  . Ciprofloxacin Hcl Shortness Of Breath and Rash    fever  . Vancomycin Shortness Of Breath and Rash    fever  . Heparin Other (See Comments)    Blood-clots and bleeding.    Patient Measurements: Height: 5\' 10"  (177.8 cm) Weight: 184 lb 4.9 oz (83.6 kg) IBW/kg (Calculated) : 73   Vital Signs: Temp: 99.1 F (37.3 C) (01/24 1126) Temp src: Axillary (01/24 1126) BP: 98/65 mmHg (01/24 1200) Pulse Rate: 96  (01/24 1200)  Labs:  Basename 11/11/11 0800 11/11/11 0430 11/10/11 1600 11/10/11 1435 11/10/11 0705  HGB 10.1* -- 10.8* -- --  HCT 30.0* -- 31.8* 30.0* --  PLT 149* -- 180 -- 226  APTT -- -- 53* -- 33  LABPROT -- 18.1* 20.0* -- 14.5  INR -- 1.47 1.67* -- 1.11  HEPARINUNFRC -- -- -- -- --  CREATININE 0.85 -- 0.82 -- 0.95  CKTOTAL -- -- -- -- --  CKMB -- -- -- -- --  TROPONINI -- -- -- -- --   Estimated Creatinine Clearance: 90.7 ml/min (by C-G formula based on Cr of 0.85).  Medical History: Past Medical History  Diagnosis Date  . Clotting disorder   . Hyperlipidemia   . Degenerative disc disease   . Peripheral arterial disease   . Leg fracture, left   . Leg fracture, right   . PVD (peripheral vascular disease)   . COPD (chronic obstructive pulmonary disease)   . Anxiety   . Depression     Medications:  Prescriptions prior to admission  Medication Sig Dispense Refill  . albuterol (PROVENTIL HFA;VENTOLIN HFA) 108 (90 BASE) MCG/ACT inhaler Inhale 2 puffs into the lungs every 6 (six) hours as needed. For shortness of breath.       Marland Kitchen atorvastatin (LIPITOR) 40 MG tablet Take 40 mg by mouth daily.        . budesonide-formoterol (SYMBICORT) 160-4.5 MCG/ACT inhaler Inhale 2 puffs into the lungs 2 (two) times daily.        . Calcium-Vitamin D-Vitamin K [id number] MG-UNT-MCG CHEW Chew 2 tablets by mouth daily.        Marland Kitchen  CIALIS 20 MG tablet Take 20 mg by mouth daily as needed. For erectile dysfunction.      . ferrous fumarate (HEMOCYTE - 106 MG FE) 325 (106 FE) MG TABS Take 1 tablet by mouth daily.        . sertraline (ZOLOFT) 50 MG tablet Take 50 mg by mouth daily.        Marland Kitchen tiotropium (SPIRIVA) 18 MCG inhalation capsule Place 18 mcg into inhaler and inhale daily.        . traZODone (DESYREL) 50 MG tablet Take 25 mg by mouth at bedtime. 1/2 tablet at night      . warfarin (COUMADIN) 1 MG tablet Take 2 mg by mouth 2 (two) times a week. Monday and Wednesday.      . warfarin (COUMADIN) 3 MG tablet Take 3 mg by mouth See admin instructions. Sunday, Tuesday, Thursday, Friday and saturday       Scheduled:    . ipratropium  0.5 mg Nebulization Q6H   And  . albuterol  2.5 mg Nebulization Q6H  . bivalirudin  0.75 mg/kg Intravenous Once  . cefUROXime (ZINACEF)  IV  1.5 g Intravenous Q12H  . famotidine (PEPCID) IV  20 mg Intravenous Q12H  . fondaparinux  2.5 mg Subcutaneous Q24H  . DISCONTD: albuterol  2.5 mg Nebulization Q4H  . DISCONTD: bivalirudin (ANGIOMAX) infusion 5 mg/mL (Cath Lab,ACS,PCI indication)  1.75 mg/kg/hr (Order-Specific) Intravenous To OR  . DISCONTD: budesonide-formoterol  2 puff Inhalation BID  . DISCONTD: calcium carbonate  1 tablet Oral BID WC  . DISCONTD: calcium carbonate  1 tablet Oral BID WC  . DISCONTD: Calcium-Vitamin D-Vitamin K  2 tablet Oral Daily  . DISCONTD: cefUROXime (ZINACEF)  IV  1.5 g Intravenous Once  . DISCONTD: cholecalciferol  1,000 Units Oral Daily  . DISCONTD: docusate sodium  100 mg Oral Daily  . DISCONTD: ferrous fumarate  1 tablet Oral Daily  . DISCONTD: ipratropium  0.5 mg Nebulization Q4H  . DISCONTD: ipratropium-albuterol  3 mL Nebulization Q4H  . DISCONTD: morphine   Intravenous Q4H  . DISCONTD: mulitivitamin with minerals  1 tablet Oral Daily  . DISCONTD: rosuvastatin  20 mg Oral q1800  . DISCONTD: sertraline  50 mg Oral Daily  . DISCONTD: simvastatin  40 mg  Oral Daily  . DISCONTD: tiotropium  18 mcg Inhalation Daily  . DISCONTD: tiotropium  18 mcg Inhalation Daily  . DISCONTD: tiotropium  18 mcg Inhalation Daily  . DISCONTD: traZODone  25 mg Oral QHS  . DISCONTD: warfarin  2 mg Oral 2 times weekly  . DISCONTD: warfarin  2 mg Oral Custom  . DISCONTD: warfarin  2 mg Oral 2 times weekly    Assessment: 66 y/o male patient on chronic coumadin for h/o PVD and "clotting disorder." Possible HIT in past, now to resume coumadin with arixtra s/p aortobifemoral bypass. INR subtherapeutic, will resume coumadin at 1.5x pta dose to get therapeutic quicker.  Goal of Therapy:  INR 2-3   Plan:  Coumadin 5mg  today and f/u daily protime.  Verlene Mayer, PharmD, BCPS Pager [phone] 11/11/2011,12:56 PM

## 2011-11-11 NOTE — Consult Note (Signed)
Name: MARKIS LANGLAND MRN: [id number] DOB: 1946-10-04    LOS: 1  PCCM CONSULT NOTE  History of Present Illness: 66 yo male with a history of severe COPD followed by pulmonologist at [facility], severe peripheral vascular disease and bilateral lower extremity intermittent claudication with known left iliac artery occlusion. He was admitted on 1/23 for aortobifemoral bypass to deal with his left iliac vascular disease and prophylactically deal with his right iliac vascular disease. Was extubated postoperatively but developed increasing hypoxia and shortness of breath on 1/24 and pulmonary critical care was called to consult.  Lines / Drains: L rad aline 1/23>>>1/24 R IJ CVL 1/23>>>  Cultures: none  Antibiotics: Zinacef (periop) 1/23>>1/24  Tests / Events: 1/23- aorto-bifem 1/24- hypoxia, no distress   Past Medical History  Diagnosis Date  . Clotting disorder   . Hyperlipidemia   . Degenerative disc disease   . Peripheral arterial disease   . Leg fracture, left   . Leg fracture, right   . PVD (peripheral vascular disease)   . COPD (chronic obstructive pulmonary disease)   . Anxiety   . Depression    Past Surgical History  Procedure Date  . Redo r to l femoral-femoral bypass 10/27/10  . Iliofemoral endarterectomy with bovine patch angioplasty 10/27/10  . Thombectomy and fem pop bypass 08/17/10   . Aotogram 07/30/10  . Neck surgery   . Cardiovascular stress test 08/03/2010    EF 53%. NO ISCHEMIA  . Fracture surgery     pins left femur   Prior to Admission medications   Medication Sig Start Date End Date Taking? Authorizing Provider  albuterol (PROVENTIL HFA;VENTOLIN HFA) 108 (90 BASE) MCG/ACT inhaler Inhale 2 puffs into the lungs every 6 (six) hours as needed. For shortness of breath.    Yes Historical Provider, MD  atorvastatin (LIPITOR) 40 MG tablet Take 40 mg by mouth daily.     Yes Historical Provider, MD  budesonide-formoterol (SYMBICORT) 160-4.5 MCG/ACT inhaler Inhale  2 puffs into the lungs 2 (two) times daily.     Yes Historical Provider, MD  Calcium-Vitamin D-Vitamin K [phone] MG-UNT-MCG CHEW Chew 2 tablets by mouth daily.     Yes Historical Provider, MD  CIALIS 20 MG tablet Take 20 mg by mouth daily as needed. For erectile dysfunction. 08/14/11  Yes Historical Provider, MD  ferrous fumarate (HEMOCYTE - 106 MG FE) 325 (106 FE) MG TABS Take 1 tablet by mouth daily.     Yes Historical Provider, MD  sertraline (ZOLOFT) 50 MG tablet Take 50 mg by mouth daily.     Yes Historical Provider, MD  tiotropium (SPIRIVA) 18 MCG inhalation capsule Place 18 mcg into inhaler and inhale daily.     Yes Historical Provider, MD  traZODone (DESYREL) 50 MG tablet Take 25 mg by mouth at bedtime. 1/2 tablet at night   Yes Historical Provider, MD  warfarin (COUMADIN) 1 MG tablet Take 2 mg by mouth 2 (two) times a week. Monday and Wednesday. 08/10/11  Yes Historical Provider, MD  warfarin (COUMADIN) 3 MG tablet Take 3 mg by mouth See admin instructions. Sunday, Tuesday, Thursday, Friday and saturday    Historical Provider, MD   Allergies Allergies  Allergen Reactions  . Ciprofloxacin Hcl Shortness Of Breath and Rash    fever  . Vancomycin Shortness Of Breath and Rash    fever  . Heparin Other (See Comments)    Blood-clots and bleeding.    Family History Family History  Problem Relation Age of Onset  .  Cancer Mother   . Emphysema Mother   . Lung cancer Mother     Social History History   Social History  . Marital Status: Married    Spouse Name: N/A    Number of Children: N/A  . Years of Education: N/A   Occupational History  . Not on file.   Social History Main Topics  . Smoking status: Former Smoker -- 1.5 packs/day    Quit date: 08/13/2010  . Smokeless tobacco: Not on file  . Alcohol Use: No  . Drug Use: No  . Sexually Active:    Other Topics Concern  . Not on file   Social History Narrative  . No narrative on file    Review Of Systems     Denies SOB above baseline.  Does state he has had a "dry cough" on and off for the past 2 weeks prior to surgery.  C/o abd pain, "cannot take a deep breath" r/t abd pain.  Denies chest pain, hemoptysis, leg/calf pain, PND, orthopnea, recent travel or recent sick contacts. All other systems reviewed and were neg.   Vital Signs: Temp:  [97.5 F (36.4 C)-100.4 F (38 C)] 99.7 F (37.6 C) (01/24 0700) Pulse Rate:  [82-120] 93  (01/24 0700) Resp:  [9-23] 20  (01/24 0700) BP: (65-112)/(51-69) 112/66 mmHg (01/24 0700) SpO2:  [87 %-98 %] 93 % (01/24 0810) Arterial Line BP: (95-138)/(40-57) 128/57 mmHg (01/24 0700) FiO2 (%):  [50 %] 50 % (01/24 0600) Weight:  [184 lb 4.9 oz (83.6 kg)] 184 lb 4.9 oz (83.6 kg) (01/24 0500) I/O last 3 completed shifts: In: 5928.8 [I.V.:4618.8; NG/GT:210; IV Piggyback:1100] Out: 2450 [Urine:1700; Emesis/NG output:150; Blood:600]  Physical Examination: General: chronically ill appearing, NAD in bed HEENT: mm moist, R IJ CVL c/d Neuro: awake and alert, appropriate, MAE  Lungs: resps even non labored on venti mask, diminished bases bilat, rare exp wheeze  Cards: s1s2 rrr, no m/r/g Abd: soft, round, tender diffusely post op, midline and bilat groin dsg c/d  Ext: warm and dry, no edema    Ventilator settings: Vent Mode:  [-]  FiO2 (%):  [50 %] 50 %  Labs and Imaging:   CBC Lab Results  Component Value Date   WBC 12.0* 11/11/2011   HGB 10.1* 11/11/2011   HCT 30.0* 11/11/2011   MCV 92.0 11/11/2011   PLT 149* 11/11/2011    BMET    Component Value Date/Time   NA 139 11/11/2011 0800   K 3.9 11/11/2011 0800   CL 108 11/11/2011 0800   CO2 24 11/11/2011 0800   GLUCOSE 148* 11/11/2011 0800   BUN 13 11/11/2011 0800   CREATININE 0.85 11/11/2011 0800   CALCIUM 7.5* 11/11/2011 0800   GFRNONAA >90 11/11/2011 0800   GFRAA >90 11/11/2011 0800    ABG    Component Value Date/Time   PHART 7.344* 11/11/2011 0447   PCO2ART 36.8 11/11/2011 0447   PO2ART 59.0* 11/11/2011 0447    HCO3 19.9* 11/11/2011 0447   TCO2 21 11/11/2011 0447   ACIDBASEDEF 5.0* 11/11/2011 0447   O2SAT 88.0 11/11/2011 0447    Lab 11/11/11 0430  INR 1.47    Dg Chest Portable 1 View  11/11/2011  *RADIOLOGY REPORT*  Clinical Data: Postop.  Shortness of breath.  PORTABLE CHEST - 1 VIEW  Comparison: 11/10/2011  Findings: Swan-Ganz catheter and nasogastric tube remain in place. Increased bibasilar atelectasis is seen since prior exam.  Upper lung fields remain clear.  No definite pleural effusion.  Heart  size is normal.  IMPRESSION: Increased bibasilar atelectasis.  Original Report Authenticated By: Danae Orleans, M.D.   Dg Chest Portable 1 View  11/10/2011  *RADIOLOGY REPORT*  Clinical Data: Postop from aortobifemoral bypass grafting.  Central line placement.  PORTABLE CHEST - 1 VIEW  Comparison: 11/02/2011  Findings: A right jugular Swan-Ganz catheter is now seen with the tip in the proximal right pulmonary artery.  No evidence of pneumothorax.  A new nasogastric tube is now seen entering the stomach.  Mild bibasilar atelectasis is noted.  Lungs otherwise clear.  No evidence of pleural effusion.  Heart size is within normal limits.  IMPRESSION:  1.  Right jugular Swan-Ganz catheter and nasogastric tube in appropriate position.  No evidence of pneumothorax. 2.  Mild bibasilar atelectasis.  Original Report Authenticated By: Danae Orleans, M.D.     Assessment and Plan: 1. Acute on chronic resp failure - in setting severe COPD now post op aortobifemoral bypass.  CXR with bibasilar atx. Not hypercarbic.  No distress.  Etiology: r/o ATX, int/hilar edema?, r/o ischemia, no evidence TRALI in fact i don't see Tx in EPIC  PLAN -  D/c symbicort/spiriva for now - resume at d/c duonebs via flutter Aggressive pulm hygiene Cont O2 - titrate for sats 88-92% F/u CXR in am Cont mucolytic  Mobilize if ok with VVS  No need steroids at this time  Avoid oversedation ecg neg st changes, with int predom, add set  enzymes abg reviewed, excellent ventialtion Will ask more ?? about blood clotting disorder in chart any hep allergy HITT in past? On bival  Wife updated   Mcarthur Rossetti. Tyson Alias, MD, FACP Pgr: [phone] [facility]   Danford Bad, NP 11/11/2011  9:12 AM Pager: [phone]  *Care during the described time interval was provided by me and/or other providers on the critical care team. I have reviewed this patient's available data, including medical history, events of note, physical examination and test results as part of my evaluation.

## 2011-11-11 NOTE — Evaluation (Signed)
Physical Therapy Evaluation Patient Details Name: [name] MRN: [id number] DOB: 09-06-1946 Today's Date: 11/11/2011  Problem List:  Patient Active Problem List  Diagnoses  . Intermittent claudication  . Preop cardiovascular exam  . Peripheral vascular disease, unspecified    Past Medical History:  Past Medical History  Diagnosis Date  . Clotting disorder   . Hyperlipidemia   . Degenerative disc disease   . Peripheral arterial disease   . Leg fracture, left   . Leg fracture, right   . PVD (peripheral vascular disease)   . COPD (chronic obstructive pulmonary disease)   . Anxiety   . Depression    Past Surgical History:  Past Surgical History  Procedure Date  . Redo r to l femoral-femoral bypass 10/27/10  . Iliofemoral endarterectomy with bovine patch angioplasty 10/27/10  . Thombectomy and fem pop bypass 08/17/10   . Aotogram 07/30/10  . Neck surgery   . Cardiovascular stress test 08/03/2010    EF 53%. NO ISCHEMIA  . Fracture surgery     pins left femur  . Aorta - bilateral femoral artery bypass graft 11/10/2011    Procedure: AORTA BIFEMORAL BYPASS GRAFT;  Surgeon: Nilda Simmer, MD;  Location: [facility] OR;  Service: Vascular;  Laterality: N/A;    PT Assessment/Plan/Recommendation PT Assessment Clinical Impression Statement: [name] is a 66 y.o. male who is  up s/p procedure aorta bifemoral bypass graft. Was extubated postoperatively but developed increasing hypoxia and shortness of breath on 1/24 and pulmonary critical care was called to consult. PT/OT consulted for mobility. Pt primarily limited by pain. Will benefit physical therapy in the acute setting to maximize mobility and independence so as to increase safety at home. Will likely progress well once pain more controlled. Rec HHPT for f/u therapies.  PT Recommendation/Assessment: Patient will need skilled PT in the acute care venue PT Problem List: Decreased mobility;Pain;Cardiopulmonary status limiting  activity;Decreased activity tolerance PT Therapy Diagnosis : Acute pain;Difficulty walking PT Plan PT Frequency: Min 3X/week PT Treatment/Interventions: DME instruction;Gait training;Stair training;Functional mobility training;Neuromuscular re-education;Balance training;Therapeutic exercise;Patient/family education;Therapeutic activities PT Recommendation Follow Up Recommendations: Home health PT Equipment Recommended:  (TBD) PT Goals  Acute Rehab PT Goals PT Goal Formulation: With patient Time For Goal Achievement: 7 days Pt will Roll Supine to Right Side: with modified independence PT Goal: Rolling Supine to Right Side - Progress: Goal set today Pt will Roll Supine to Left Side: with modified independence PT Goal: Rolling Supine to Left Side - Progress: Goal set today Pt will go Supine/Side to Sit: with modified independence PT Goal: Supine/Side to Sit - Progress: Goal set today Pt will Sit at Edge of Bed: with modified independence PT Goal: Sit at [place] Of Bed - Progress: Goal set today Pt will go Sit to Supine/Side: with modified independence PT Goal: Sit to Supine/Side - Progress: Goal set today Pt will go Sit to Stand: with modified independence PT Goal: Sit to Stand - Progress: Goal set today Pt will go Stand to Sit: with modified independence PT Goal: Stand to Sit - Progress: Goal set today Pt will Transfer Bed to Chair/Chair to Bed: with modified independence PT Transfer Goal: Bed to Chair/Chair to Bed - Progress: Goal set today Pt will Ambulate: >150 feet;with modified independence;with least restrictive assistive device PT Goal: Ambulate - Progress: Goal set today Pt will Go Up / Down Stairs: 1-2 stairs;with min assist;with least restrictive assistive device PT Goal: Up/Down Stairs - Progress: Goal set today  PT Evaluation Precautions/Restrictions  Precautions Required Braces or Orthoses: No Restrictions Weight Bearing Restrictions: No Prior Functioning  Home  Living Lives With: Spouse;Daughter (daughter is young) Actor Help From: Family Type of Home: House Home Layout: Two level;Able to live on main level with bedroom/bathroom Alternate Level Stairs-Number of Steps: basement but pt doesn't need to go down there Home Access: Stairs to enter Entrance Stairs-Rails: None Entrance Stairs-Number of Steps: 2 Bathroom Shower/Tub: Health visitor: Standard Home Adaptive Equipment: Bedside commode/3-in-1;Straight cane Prior Function Level of Independence: Independent with basic ADLs;Independent with homemaking with ambulation;Independent with gait;Independent with transfers Driving: Yes Vocation: Full time employment 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 Overall Cognitive Status: Appears within functional limits for tasks assessed Orientation Level: Oriented X4 Sensation/Coordination Sensation Light Touch: Appears Intact Coordination Gross Motor Movements are Fluid and Coordinated: Yes Fine Motor Movements are Fluid and Coordinated: Yes Extremity Assessment RUE Assessment RUE Assessment:  (defer to OT) LUE Assessment LUE Assessment:  (see OT note) RLE Assessment RLE Assessment: Within Functional Limits LLE Assessment LLE Assessment: Within Functional Limits Mobility (including Balance) Bed Mobility Bed Mobility: Yes Rolling Right: 1: +2 Total assist Rolling Right Details (indicate cue type and reason): +2totalpt65% with cues for sequencing and assist for follow through with pad  Right Sidelying to Sit: 1: +2 Total assist (40%) Right Sidelying to Sit Details (indicate cue type and reason): +2totalpt40% to bring trunk upright (pt very stiff secondary to pain) Sitting - Scoot to Edge of Bed: 4: Min assist Transfers Transfers: Yes Sit to Stand: 1: +2 Total assist;From bed;With upper extremity assist (70%) Sit to Stand Details (indicate cue type and reason): facilitation for  anterior translation and follow through; pt using RUE to push from bed while holding pillow against his abdomen with LUE to splint for pain Stand to Sit: 4: Min assist;To chair/3-in-1;With armrests;With upper extremity assist Stand to Sit Details: min cues control descent and sequence stand->sit to recliner, RUE reaching for arm rest to guide self into chair Stand Pivot Transfers: 1: +2 Total assist (70%) Stand Pivot Transfer Details (indicate cue type and reason): RUE HHA during transfer; pt with slow shuffled steps and flexed trunk  Ambulation/Gait Ambulation/Gait: No  Posture/Postural Control Posture/Postural Control: Postural limitations Postural Limitations: flexed trunk secondary to pain Balance Balance Assessed: No Exercise  Total Joint Exercises Ankle Circles/Pumps: AROM;10 reps;Supine;Both End of Session PT - End of Session Activity Tolerance: Patient tolerated treatment well;Patient limited by fatigue;Patient limited by pain;Treatment limited secondary to medical complications (Comment) (pt on 50% venturi mask) Patient left: in chair;with call bell in reach;with family/visitor present Nurse Communication: Mobility status for transfers General Behavior During Session: [facility] for tasks performed Cognition: [facility] for tasks performed  [facility] HELEN 11/11/2011, 3:04 PM

## 2011-11-11 NOTE — Progress Notes (Signed)
[facility] [place]  Post-op  Intra-abdominal Surgery note  Date of Surgery: 11/10/2011 Surgeon: Surgeon(s): Nilda Simmer, MD Pryor Ochoa, MD POD: 1 Day Post-Op Procedure(s): AORTA BIFEMORAL BYPASS GRAFT  History of Present Illness  [name] is a 66 y.o. male who is  up s/p Procedure(s): AORTA BIFEMORAL BYPASS GRAFT Pt is doing well. complains of incisional pain; denies nausea/vomiting; denies diarrhea. has not had flatus;has not had BM  IMAGING: Dg Chest Portable 1 View  11/10/2011  *RADIOLOGY REPORT*  Clinical Data: Postop from aortobifemoral bypass grafting.  Central line placement.  PORTABLE CHEST - 1 VIEW  Comparison: 11/02/2011  Findings: A right jugular Swan-Ganz catheter is now seen with the tip in the proximal right pulmonary artery.  No evidence of pneumothorax.  A new nasogastric tube is now seen entering the stomach.  Mild bibasilar atelectasis is noted.  Lungs otherwise clear.  No evidence of pleural effusion.  Heart size is within normal limits.  IMPRESSION:  1.  Right jugular Swan-Ganz catheter and nasogastric tube in appropriate position.  No evidence of pneumothorax. 2.  Mild bibasilar atelectasis.  Original Report Authenticated By: Danae Orleans, M.D.    Significant Diagnostic Studies: CBC    Component Value Date/Time   WBC 13.1* 11/10/2011 1600   WBC 6.4 09/15/2010 1002   RBC 3.48* 11/10/2011 1600   RBC 4.32 09/15/2010 1002   HGB 10.8* 11/10/2011 1600   HGB 13.0 09/15/2010 1002   HCT 31.8* 11/10/2011 1600   HCT 39.1 09/15/2010 1002   PLT 180 11/10/2011 1600   PLT 175 09/15/2010 1002   MCV 91.4 11/10/2011 1600   MCV 90.5 09/15/2010 1002   MCH 31.0 11/10/2011 1600   MCH 30.1 09/15/2010 1002   MCHC 34.0 11/10/2011 1600   MCHC 33.2 09/15/2010 1002   RDW 13.4 11/10/2011 1600   RDW 13.4 09/15/2010 1002   LYMPHSABS 2.0 11/02/2011 1008   LYMPHSABS 1.9 09/15/2010 1002   MONOABS 0.8 11/02/2011 1008   MONOABS 0.8 09/15/2010 1002   EOSABS 0.1 11/02/2011 1008   EOSABS 0.2 09/15/2010 1002   BASOSABS 0.1 11/02/2011 1008   BASOSABS 0.1 09/15/2010 1002    BMET    Component Value Date/Time   NA 138 11/10/2011 1600   K 4.6 11/10/2011 1600   CL 108 11/10/2011 1600   CO2 24 11/10/2011 1600   GLUCOSE 150* 11/10/2011 1600   BUN 11 11/10/2011 1600   CREATININE 0.82 11/10/2011 1600   CALCIUM 7.9* 11/10/2011 1600   GFRNONAA >90 11/10/2011 1600   GFRAA >90 11/10/2011 1600    COAG Lab Results  Component Value Date   INR 1.47 11/11/2011   INR 1.67* 11/10/2011   INR 1.11 11/10/2011   PROTIME 25.2* 09/15/2010   PROTIME 28.8* 09/09/2010   PROTIME 24.0* 09/03/2010   No results found for this basename: PTT    I/O last 3 completed shifts: In: 5928.8 [I.V.:4618.8; NG/GT:210; IV Piggyback:1100] Out: 2450 [Urine:1700; Emesis/NG output:150; Blood:600]    Physical Examination BP Readings from Last 3 Encounters:  11/11/11 112/66  11/11/11 112/66  11/02/11 135/84   Temp Readings from Last 3 Encounters:  11/11/11 99.7 F (37.6 C)   11/11/11 99.7 F (37.6 C)   11/02/11 97 F (36.1 C) Oral   SpO2 Readings from Last 3 Encounters:  11/11/11 91%  11/11/11 91%  11/02/11 94%   Pulse Readings from Last 3 Encounters:  11/11/11 93  11/11/11 93  11/02/11 90    General: A&O  x 3, WDWN male in NAD Pulmonary: normal non-labored breathing , without Rales, rhonchi,  wheezing Cardiac: Heart rate : regular ,  Abdomen:mildly distended Abdominal wound:clean, dry, intact  Neurologic: A&O X 3; Appropriate Affect ; SENSATION: normal; MOTOR FUNCTION:  moving all extremities equally. Speech is fluent/normal  Vascular Exam:BLE warm and well perfused Extremities without ischemic changes, no Gangrene , no cellulitis; no open wounds;   LOWER EXTREMITY PULSES           RIGHT                                      LEFT      DORSALIS PEDIS      ANTERIOR TIBIAL doppler doppler    Non-Invasive Vascular Imaging ABI'S: pending  Assessment: [name] is a 66 y.o. male who is 1 Day Post-Op Procedure(s): AORTA BIFEMORAL BYPASS GRAFT Pt doing well. CXR clear U/O good at 50cc/hr O2 Sats on the low side - pt former smoker and on mult inhalers  Plan:  PCA DC a-line, Swan OOB  Cont. inhalers  Marlowe Shores [phone] 11/11/2011 7:36 AM

## 2011-11-12 LAB — BASIC METABOLIC PANEL
BUN: 15 mg/dL (ref 6–23)
Calcium: 8.1 mg/dL — ABNORMAL LOW (ref 8.4–10.5)
Chloride: 106 mEq/L (ref 96–112)
Creatinine, Ser: 0.85 mg/dL (ref 0.50–1.35)
GFR calc Af Amer: >90 mL/min (ref >90)

## 2011-11-12 LAB — CBC
HCT: 28.1 % — ABNORMAL LOW (ref 39.0–52.0)
MCH: 31.2 pg (ref 26.0–34.0)
MCV: 93.4 fL (ref 78.0–100.0)
RDW: 13.8 % (ref 11.5–15.5)
WBC: 14.8 10*3/uL — ABNORMAL HIGH (ref 4.0–10.5)

## 2011-11-12 LAB — CARDIAC PANEL(CRET KIN+CKTOT+MB+TROPI): Total CK: 402 U/L — ABNORMAL HIGH (ref 7–232)

## 2011-11-12 MED ORDER — WARFARIN SODIUM 5 MG PO TABS
5.0000 mg | ORAL_TABLET | Freq: Once | ORAL | Status: AC
  Administered 2011-11-12: 5 mg via ORAL
  Filled 2011-11-12: qty 1

## 2011-11-12 MED ORDER — FAMOTIDINE 20 MG PO TABS
20.0000 mg | ORAL_TABLET | Freq: Two times a day (BID) | ORAL | Status: DC
  Administered 2011-11-13 – 2011-11-18 (×12): 20 mg via ORAL
  Filled 2011-11-12 (×14): qty 1

## 2011-11-12 MED FILL — Heparin Sodium (Porcine) Inj 1000 Unit/ML: INTRAMUSCULAR | Qty: 30 | Status: AC

## 2011-11-12 MED FILL — Sodium Chloride IV Soln 0.9%: INTRAVENOUS | Qty: 1000 | Status: AC

## 2011-11-12 MED FILL — Sodium Chloride Irrigation Soln 0.9%: Qty: 3000 | Status: AC

## 2011-11-12 NOTE — Progress Notes (Addendum)
[facility] [place]  Post-op  Intra-abdominal Surgery note  Date of Surgery: 11/10/2011 Surgeon: Surgeon(s): Nilda Simmer, MD Pryor Ochoa, MD POD: 2 Days Post-Op Procedure(s): AORTA BIFEMORAL BYPASS GRAFT  History of Present Illness  [name] is a 66 y.o. male who is  up s/p Procedure(s): AORTA BIFEMORAL BYPASS GRAFT Pt is doing well. complains of incisional pain; denies nausea/vomiting; denies diarrhea. has not had flatus;has not had BM  Significant Diagnostic Studies: CBC    Component Value Date/Time   WBC 14.8* 11/12/2011 0508   WBC 6.4 09/15/2010 1002   RBC 3.01* 11/12/2011 0508   RBC 4.32 09/15/2010 1002   HGB 9.4* 11/12/2011 0508   HGB 13.0 09/15/2010 1002   HCT 28.1* 11/12/2011 0508   HCT 39.1 09/15/2010 1002   PLT 155 11/12/2011 0508   PLT 175 09/15/2010 1002   MCV 93.4 11/12/2011 0508   MCV 90.5 09/15/2010 1002   MCH 31.2 11/12/2011 0508   MCH 30.1 09/15/2010 1002   MCHC 33.5 11/12/2011 0508   MCHC 33.2 09/15/2010 1002   RDW 13.8 11/12/2011 0508   RDW 13.4 09/15/2010 1002   LYMPHSABS 2.0 11/02/2011 1008   LYMPHSABS 1.9 09/15/2010 1002   MONOABS 0.8 11/02/2011 1008   MONOABS 0.8 09/15/2010 1002   EOSABS 0.1 11/02/2011 1008   EOSABS 0.2 09/15/2010 1002   BASOSABS 0.1 11/02/2011 1008   BASOSABS 0.1 09/15/2010 1002    BMET    Component Value Date/Time   NA 139 11/12/2011 0508   K 3.7 11/12/2011 0508   CL 106 11/12/2011 0508   CO2 25 11/12/2011 0508   GLUCOSE 152* 11/12/2011 0508   BUN 15 11/12/2011 0508   CREATININE 0.85 11/12/2011 0508   CALCIUM 8.1* 11/12/2011 0508   GFRNONAA >90 11/12/2011 0508   GFRAA >90 11/12/2011 0508    COAG Lab Results  Component Value Date   INR 1.70* 11/12/2011   INR 1.47 11/11/2011   INR 1.67* 11/10/2011   PROTIME 25.2* 09/15/2010   PROTIME 28.8* 09/09/2010   PROTIME 24.0* 09/03/2010   No results found for this basename: PTT    I/O last 3 completed shifts: In: 2110 [I.V.:1460; NG/GT:450; IV  Piggyback:200] Out: 2295 [Urine:1855; Emesis/NG output:440]    Physical Examination BP Readings from Last 3 Encounters:  11/12/11 128/68  11/12/11 128/68  11/02/11 135/84   Temp Readings from Last 3 Encounters:  11/12/11 97.9 F (36.6 C) Oral  11/12/11 97.9 F (36.6 C) Oral  11/02/11 97 F (36.1 C) Oral   SpO2 Readings from Last 3 Encounters:  11/12/11 98%  11/12/11 98%  11/02/11 94%   Pulse Readings from Last 3 Encounters:  11/12/11 99  11/12/11 99  11/02/11 90    General: A&O x 3, WDWN male in NAD Pulmonary: normal non-labored breathing , without Rales, rhonchi,  wheezing Cardiac: Heart rate : regular ,  Abdomen:abdomen soft and mildly distended Abdominal wound:clean, dry, intact  Neurologic: A&O X 3; Appropriate Affect ; SENSATION: normal; MOTOR FUNCTION:  moving all extremities equally. Speech is fluent/normal  Vascular Exam:BLE warm and well perfused Extremities without ischemic changes  Assessment: [name] is a 66 y.o. male who is 2 Days Post-Op Procedure(s): AORTA BIFEMORAL BYPASS GRAFT Pt O2 sats better, now on 4LNC Abdomen still quiet, NGT 290cc/24 hours  Plan:  dulcolax supp today ?dc NGT and begin Ice chips Remove dressings today Begin ambulation Cont pulm toiletry  Coumadin  Transfer to 3300 DC foley in am -  strict I/O Marlowe Shores [phone] 11/12/2011 7:25 AM  Addendum  I have independently interviewed and examined the patient, and I agree with the physician assistant's findings.  Limited return of BS.  Arixtra bridge to coumadin. D/C Arixtra once therapeutic on Coumadin.  Leonides Sake, MD [facility] [place] Office: [phone] Pager: [phone]

## 2011-11-12 NOTE — Consult Note (Signed)
Name: [name] MRN: [id number] DOB: 1946-05-27    LOS: 2  PCCM  PROGRESS  NOTE  History of Present Illness: 66 yo male with a history of severe COPD followed by pulmonologist at [facility], severe peripheral vascular disease and bilateral lower extremity intermittent claudication with known left iliac artery occlusion. He was admitted on 1/23 for aortobifemoral bypass to deal with his left iliac vascular disease and prophylactically deal with his right iliac vascular disease. Was extubated postoperatively but developed increasing hypoxia and shortness of breath on 1/24 and pulmonary critical care was called to consult.  Lines / Drains: L rad aline 1/23>>>1/24 R IJ CVL 1/23>>>  Cultures: none  Antibiotics: Zinacef (periop) 1/23>>1/24  Tests / Events: 1/23- aorto-bifem 1/24- hypoxia, no distress  Subjective: Pt improved, now on [place].  Needs a flutter valve.    Past Medical History  Diagnosis Date  . Clotting disorder   . Hyperlipidemia   . Degenerative disc disease   . Peripheral arterial disease   . Leg fracture, left   . Leg fracture, right   . PVD (peripheral vascular disease)   . COPD (chronic obstructive pulmonary disease)   . Anxiety   . Depression    Past Surgical History  Procedure Date  . Redo r to l femoral-femoral bypass 10/27/10  . Iliofemoral endarterectomy with bovine patch angioplasty 10/27/10  . Thombectomy and fem pop bypass 08/17/10   . Aotogram 07/30/10  . Neck surgery   . Cardiovascular stress test 08/03/2010    EF 53%. NO ISCHEMIA  . Fracture surgery     pins left femur  . Aorta - bilateral femoral artery bypass graft 11/10/2011    Procedure: AORTA BIFEMORAL BYPASS GRAFT;  Surgeon: Nilda Simmer, MD;  Location: [facility] OR;  Service: Vascular;  Laterality: N/A;   Prior to Admission medications   Medication Sig Start Date End Date Taking? Authorizing Provider  albuterol (PROVENTIL HFA;VENTOLIN HFA) 108 (90 BASE) MCG/ACT inhaler Inhale 2 puffs  into the lungs every 6 (six) hours as needed. For shortness of breath.    Yes Historical Provider, MD  atorvastatin (LIPITOR) 40 MG tablet Take 40 mg by mouth daily.     Yes Historical Provider, MD  budesonide-formoterol (SYMBICORT) 160-4.5 MCG/ACT inhaler Inhale 2 puffs into the lungs 2 (two) times daily.     Yes Historical Provider, MD  Calcium-Vitamin D-Vitamin K [phone] MG-UNT-MCG CHEW Chew 2 tablets by mouth daily.     Yes Historical Provider, MD  CIALIS 20 MG tablet Take 20 mg by mouth daily as needed. For erectile dysfunction. 08/14/11  Yes Historical Provider, MD  ferrous fumarate (HEMOCYTE - 106 MG FE) 325 (106 FE) MG TABS Take 1 tablet by mouth daily.     Yes Historical Provider, MD  sertraline (ZOLOFT) 50 MG tablet Take 50 mg by mouth daily.     Yes Historical Provider, MD  tiotropium (SPIRIVA) 18 MCG inhalation capsule Place 18 mcg into inhaler and inhale daily.     Yes Historical Provider, MD  traZODone (DESYREL) 50 MG tablet Take 25 mg by mouth at bedtime. 1/2 tablet at night   Yes Historical Provider, MD  warfarin (COUMADIN) 1 MG tablet Take 2 mg by mouth 2 (two) times a week. Monday and Wednesday. 08/10/11  Yes Historical Provider, MD  warfarin (COUMADIN) 3 MG tablet Take 3 mg by mouth See admin instructions. Sunday, Tuesday, Thursday, Friday and saturday    Historical Provider, MD   Allergies Allergies  Allergen Reactions  .  Ciprofloxacin Hcl Shortness Of Breath and Rash    fever  . Vancomycin Shortness Of Breath and Rash    fever  . Heparin Other (See Comments)    Blood-clots and bleeding.    Family History Family History  Problem Relation Age of Onset  . Cancer Mother   . Emphysema Mother   . Lung cancer Mother     Social History History   Social History  . Marital Status: Married    Spouse Name: N/A    Number of Children: N/A  . Years of Education: N/A   Occupational History  . Not on file.   Social History Main Topics  . Smoking status: Former  Smoker -- 1.5 packs/day    Quit date: 08/13/2010  . Smokeless tobacco: Not on file  . Alcohol Use: No  . Drug Use: No  . Sexually Active:    Other Topics Concern  . Not on file   Social History Narrative  . No narrative on file    Review Of Systems   Denies SOB above baseline.  Does state he has had a "dry cough" on and off for the past 2 weeks prior to surgery.  C/o abd pain, "cannot take a deep breath" r/t abd pain.  Denies chest pain, hemoptysis, leg/calf pain, PND, orthopnea, recent travel or recent sick contacts. All other systems reviewed and were neg.   Vital Signs: Temp:  [97.8 F (36.6 C)-99.1 F (37.3 C)] 97.9 F (36.6 C) (01/25 0717) Pulse Rate:  [43-137] 97  (01/25 0800) Resp:  [12-26] 17  (01/25 0800) BP: (93-133)/(55-81) 119/73 mmHg (01/25 0800) SpO2:  [86 %-100 %] 95 % (01/25 0818) FiO2 (%):  [35 %-50 %] 35 % (01/25 0500) Weight:  [81.7 kg (180 lb 1.9 oz)] 81.7 kg (180 lb 1.9 oz) (01/25 0600) I/O last 3 completed shifts: In: 2110 [I.V.:1460; NG/GT:450; IV Piggyback:200] Out: 2335 [Urine:1895; Emesis/NG output:440]  Physical Examination: General: chronically ill appearing, NAD in bed HEENT: mm moist, R IJ CVL c/d Neuro: awake and alert, appropriate, MAE  Lungs: decreased wheezes, no rhonchi or rales Cards: s1s2 rrr, no m/r/g Abd: soft, round, tender diffusely post op, midline and bilat groin dsg c/d  Ext: warm and dry, no edema    Ventilator settings: Vent Mode:  [-]  FiO2 (%):  [35 %-50 %] 35 %>>>[place] oxygen now 3L at 9am  Labs and Imaging:   CBC Lab Results  Component Value Date   WBC 14.8* 11/12/2011   HGB 9.4* 11/12/2011   HCT 28.1* 11/12/2011   MCV 93.4 11/12/2011   PLT 155 11/12/2011    BMET    Component Value Date/Time   NA 139 11/12/2011 0508   K 3.7 11/12/2011 0508   CL 106 11/12/2011 0508   CO2 25 11/12/2011 0508   GLUCOSE 152* 11/12/2011 0508   BUN 15 11/12/2011 0508   CREATININE 0.85 11/12/2011 0508   CALCIUM 8.1* 11/12/2011 0508    GFRNONAA >90 11/12/2011 0508   GFRAA >90 11/12/2011 0508    ABG    Component Value Date/Time   PHART 7.344* 11/11/2011 0447   PCO2ART 36.8 11/11/2011 0447   PO2ART 59.0* 11/11/2011 0447   HCO3 19.9* 11/11/2011 0447   TCO2 21 11/11/2011 0447   ACIDBASEDEF 5.0* 11/11/2011 0447   O2SAT 88.0 11/11/2011 0447    Lab 11/12/11 0508  INR 1.70*    Dg Chest Portable 1 View  11/11/2011  *RADIOLOGY REPORT*  Clinical Data: Postop.  Shortness of breath.  PORTABLE CHEST - 1 VIEW  Comparison: 11/10/2011  Findings: Swan-Ganz catheter and nasogastric tube remain in place. Increased bibasilar atelectasis is seen since prior exam.  Upper lung fields remain clear.  No definite pleural effusion.  Heart size is normal.  IMPRESSION: Increased bibasilar atelectasis.  Original Report Authenticated By: Danae Orleans, M.D.   Dg Chest Portable 1 View  11/10/2011  *RADIOLOGY REPORT*  Clinical Data: Postop from aortobifemoral bypass grafting.  Central line placement.  PORTABLE CHEST - 1 VIEW  Comparison: 11/02/2011  Findings: A right jugular Swan-Ganz catheter is now seen with the tip in the proximal right pulmonary artery.  No evidence of pneumothorax.  A new nasogastric tube is now seen entering the stomach.  Mild bibasilar atelectasis is noted.  Lungs otherwise clear.  No evidence of pleural effusion.  Heart size is within normal limits.  IMPRESSION:  1.  Right jugular Swan-Ganz catheter and nasogastric tube in appropriate position.  No evidence of pneumothorax. 2.  Mild bibasilar atelectasis.  Original Report Authenticated By: Danae Orleans, M.D.     Assessment and Plan: 1. Acute on chronic resp failure - in setting severe COPD now post op aortobifemoral bypass. Improved with BD and IS.  PLAN -  Cont duonebs  Add flutter valve Aggressive pulm hygiene Cont O2 - titrate for sats 88-92% F/u CXR in am Mobilize  No need steroids at this time  Avoid oversedation  Wife updated at bedside with patient    Shan Levans MD 11/12/2011  9:08 AM Beeper  [phone]  Cell  [phone]  If no response or cell goes to voicemail, call beeper [phone]

## 2011-11-12 NOTE — Progress Notes (Signed)
ANTICOAGULATION CONSULT NOTE - Follow Up Consult  Pharmacy Consult for Coumadin Indication: LAC and PVD  Allergies  Allergen Reactions  . Ciprofloxacin Hcl Shortness Of Breath and Rash    fever  . Vancomycin Shortness Of Breath and Rash    fever  . Heparin Other (See Comments)    Blood-clots and bleeding.    Patient Measurements: Height: 5\' 10"  (177.8 cm) Weight: 180 lb 1.9 oz (81.7 kg) IBW/kg (Calculated) : 73   Vital Signs: Temp: 98.1 F (36.7 C) (01/25 1246) Temp src: Oral (01/25 1246) BP: 117/63 mmHg (01/25 1200) Pulse Rate: 100  (01/25 1200)  Labs:  Basename 11/12/11 0508 11/11/11 1800 11/11/11 1100 11/11/11 0800 11/11/11 0430 11/10/11 1600 11/10/11 0705  HGB 9.4* -- -- 10.1* -- -- --  HCT 28.1* -- -- 30.0* -- 31.8* --  PLT 155 -- -- 149* -- 180 --  APTT -- -- -- -- -- 53* 33  LABPROT 20.3* -- -- -- 18.1* 20.0* --  INR 1.70* -- -- -- 1.47 1.67* --  HEPARINUNFRC -- -- -- -- -- -- --  CREATININE 0.85 -- -- 0.85 -- 0.82 --  CKTOTAL 402* 541* 427* -- -- -- --  CKMB 4.3* 3.5 2.7 -- -- -- --  TROPONINI <0.30 <0.30 <0.30 -- -- -- --   Estimated Creatinine Clearance: 90.7 ml/min (by C-G formula based on Cr of 0.85).   Medications:  Scheduled:    . ipratropium  0.5 mg Nebulization Q6H   And  . albuterol  2.5 mg Nebulization Q6H  . famotidine (PEPCID) IV  20 mg Intravenous Q12H  . fondaparinux  7.5 mg Subcutaneous Q24H  . warfarin  5 mg Oral ONCE-1800  . DISCONTD: bivalirudin  0.75 mg/kg Intravenous Once  . DISCONTD: fondaparinux  2.5 mg Subcutaneous Q24H    Assessment: 66 y/o male patient s/p aorto-bifemoral bypass graft with h/o LAC and PVD on chronic coumadin. D/t h/o heparin allergy has been started on therapeutic Arixtra 7.5mg  daily. Coumadin resumed yesterday, INR subtherapeutic but trending up.  Goal of Therapy:  INR 2-3   Plan:  Repeat Coumadin 5mg  today, continue Arixtra until INR>2 and f/u in am.  Verlene Mayer, PharmD, BCPS Pager  [phone] 11/12/2011,2:08 PM

## 2011-11-12 NOTE — Plan of Care (Signed)
Problem: Phase II Progression Outcomes Goal: Progress activity as tolerated unless otherwise ordered Outcome: Progressing Amb. 100 ft with therapy this am

## 2011-11-12 NOTE — Progress Notes (Signed)
Physical Therapy Treatment Patient Details Name: [name] MRN: [id number] DOB: [birth year] Today's Date: 11/12/2011  PT Assessment/Plan  PT - Assessment/Plan Comments on Treatment Session: Great progress today. Still mostly limited by pain.  PT Plan: Discharge plan remains appropriate PT Goals  Acute Rehab PT Goals PT Goal: Rolling Supine to Right Side - Progress: Progressing toward goal PT Goal: Rolling Supine to Left Side - Progress: Progressing toward goal PT Goal: Supine/Side to Sit - Progress: Progressing toward goal PT Goal: Sit at Edge Of Bed - Progress: Progressing toward goal PT Goal: Sit to Supine/Side - Progress: Progressing toward goal PT Goal: Sit to Stand - Progress: Progressing toward goal PT Goal: Stand to Sit - Progress: Progressing toward goal PT Transfer Goal: Bed to Chair/Chair to Bed - Progress: Progressing toward goal PT Goal: Ambulate - Progress: Progressing toward goal PT Goal: Up/Down Stairs - Progress:  (not addressed today)  PT Treatment Precautions/Restrictions  Precautions Required Braces or Orthoses: No Restrictions Weight Bearing Restrictions: No Mobility (including Balance) Bed Mobility Rolling Right: 1: +2 Total assist (70%) Sit to Sidelying Left: 1: +2 Total assist (60%) Sit to Sidelying Left Details (indicate cue type and reason): assist to guide shoulder to bed and to elevate legs to bed , pt holding pillow to splint for pain Transfers Sit to Stand: 4: Min assist;From chair/3-in-1;With armrests;With upper extremity assist Sit to Stand Details (indicate cue type and reason): cues for safe hand placement follow through Stand to Sit: To bed;With upper extremity assist;4: Min assist Stand to Sit Details: cues for sequencing to turn in prep to sit and cues for safe hand placement Ambulation/Gait Ambulation/Gait: Yes Ambulation/Gait Assistance: 4: Min assist Ambulation/Gait Assistance Details (indicate cue type and reason): flexed trunk,  quick speed; cues for safe technique with RW Ambulation Distance (Feet): 100 Feet Assistive device: Rolling walker Gait Pattern: Shuffle;Trunk flexed  Posture/Postural Control Postural Limitations: flexed from pain Exercise    End of Session PT - End of Session Equipment Utilized During Treatment: Gait belt Activity Tolerance: Patient tolerated treatment well;Patient limited by fatigue;Patient limited by pain Patient left: in bed;with call bell in reach Nurse Communication: Mobility status for transfers;Mobility status for ambulation General Behavior During Session: [facility] for tasks performed Cognition: [facility] for tasks performed  [facility] HELEN 11/12/2011, 12:22 PM

## 2011-11-13 ENCOUNTER — Inpatient Hospital Stay (HOSPITAL_COMMUNITY): Payer: BC Managed Care – PPO

## 2011-11-13 ENCOUNTER — Other Ambulatory Visit: Payer: Self-pay

## 2011-11-13 DIAGNOSIS — R0902 Hypoxemia: Secondary | ICD-10-CM

## 2011-11-13 DIAGNOSIS — J449 Chronic obstructive pulmonary disease, unspecified: Secondary | ICD-10-CM

## 2011-11-13 LAB — BLOOD GAS, ARTERIAL
Acid-Base Excess: 3 mmol/L — ABNORMAL HIGH (ref 0.0–2.0)
Acid-base deficit: 0.9 mmol/L (ref 0.0–2.0)
Delivery systems: POSITIVE
Drawn by: 347641
FIO2: 0.4 %
Inspiratory PAP: 10
O2 Saturation: 82.2 %
RATE: 8 resp/min
TCO2: 26.5 mmol/L (ref 0–100)
TCO2: 28.6 mmol/L (ref 0–100)
pCO2 arterial: 53.3 mmHg — ABNORMAL HIGH (ref 35.0–45.0)
pH, Arterial: 7.291 — ABNORMAL LOW (ref 7.350–7.450)
pO2, Arterial: 205 mmHg — ABNORMAL HIGH (ref 80.0–100.0)
pO2, Arterial: 47.7 mmHg — ABNORMAL LOW (ref 80.0–100.0)

## 2011-11-13 LAB — CBC
HCT: 25.2 % — ABNORMAL LOW (ref 39.0–52.0)
Hemoglobin: 8.6 g/dL — ABNORMAL LOW (ref 13.0–17.0)
MCHC: 34.1 g/dL (ref 30.0–36.0)
WBC: 15.6 10*3/uL — ABNORMAL HIGH (ref 4.0–10.5)

## 2011-11-13 LAB — BASIC METABOLIC PANEL
BUN: 18 mg/dL (ref 6–23)
Chloride: 109 mEq/L (ref 96–112)
Glucose, Bld: 123 mg/dL — ABNORMAL HIGH (ref 70–99)
Potassium: 3 mEq/L — ABNORMAL LOW (ref 3.5–5.1)

## 2011-11-13 LAB — PROTIME-INR: INR: 2.84 — ABNORMAL HIGH (ref 0.00–1.49)

## 2011-11-13 MED ORDER — LABETALOL HCL 5 MG/ML IV SOLN
10.0000 mg | Freq: Two times a day (BID) | INTRAVENOUS | Status: DC
  Administered 2011-11-13 – 2011-11-14 (×3): 10 mg via INTRAVENOUS
  Filled 2011-11-13 (×5): qty 4

## 2011-11-13 MED ORDER — SODIUM CHLORIDE 0.9 % IJ SOLN
INTRAMUSCULAR | Status: AC
  Administered 2011-11-13: 3 mL
  Filled 2011-11-13: qty 20

## 2011-11-13 MED ORDER — FUROSEMIDE 10 MG/ML IJ SOLN
20.0000 mg | Freq: Once | INTRAMUSCULAR | Status: AC
Start: 2011-11-13 — End: 2011-11-13
  Administered 2011-11-13: 20 mg via INTRAVENOUS
  Filled 2011-11-13 (×2): qty 2

## 2011-11-13 MED ORDER — POTASSIUM CHLORIDE 20 MEQ/15ML (10%) PO LIQD
20.0000 meq | Freq: Once | ORAL | Status: AC
  Administered 2011-11-13: 20 meq via ORAL
  Filled 2011-11-13: qty 15

## 2011-11-13 MED ORDER — TRAZODONE 25 MG HALF TABLET
25.0000 mg | ORAL_TABLET | Freq: Every day | ORAL | Status: DC
  Administered 2011-11-13 – 2011-11-15 (×3): 25 mg via ORAL
  Administered 2011-11-16: 22:00:00 via ORAL
  Administered 2011-11-17: 25 mg via ORAL
  Filled 2011-11-13 (×6): qty 1

## 2011-11-13 MED ORDER — TRAZODONE HCL 50 MG PO TABS
50.0000 mg | ORAL_TABLET | Freq: Every day | ORAL | Status: DC
  Filled 2011-11-13: qty 1

## 2011-11-13 MED ORDER — BUDESONIDE 0.5 MG/2ML IN SUSP
0.5000 mg | Freq: Two times a day (BID) | RESPIRATORY_TRACT | Status: DC
  Administered 2011-11-13 – 2011-11-14 (×2): 0.5 mg via RESPIRATORY_TRACT
  Filled 2011-11-13 (×5): qty 2

## 2011-11-13 NOTE — Progress Notes (Signed)
ANTICOAGULATION CONSULT NOTE - Follow Up Consult  Pharmacy Consult for Coumadin Indication: "clotting disorder", PVD  Allergies  Allergen Reactions  . Ciprofloxacin Hcl Shortness Of Breath and Rash    fever  . Vancomycin Shortness Of Breath and Rash    fever  . Heparin Other (See Comments)    Blood-clots and bleeding.   Assessment: 66 y/o male patient on chronic coumadin for h/o PVD and lupus anticoagulant and history of HITT discovered when patient experienced rapid clot formation hours after vascular surgery in February of 2011.  At that time SRA HITT panel was negative but patient was found to be lupus anticoagulant positive.  Per records patient was followed by a hematologist in [place] and at some point later fount to be SRA HITT positive.   Pharmacy consulted to resume coumadin with arixtra s/p aortobifemoral bypass on 1/23.  Last warfarin dose prior to admission 1/18, was on 3 mg daily except 2 mg Monday and Wednesday.  Rapid rise of INR in last 24h after only two doses of warfarin.  Given extent of hypercoagulability consider minimum 5 days of overlap of fondaparinux with warfarin.   Will give no warfarin dose today.   No bleeding noted but hematocrit continues to decline.    Admit Complaint:  Anticoagulation: Lupus anticoagulant and history of HITT on warfarin and fondaparinux  Cardiovascular: peripheral vascular disease, hyperlipidemia: post-op, consider resuming statin.  Neurology: Anxiety, Depression: consider resuming home medications, zoloft and trazodone  Pulmonary: COPD, increased work of breathing this am, with rapid response call and BiPap, on albuterol and atrovent nebs. WBC rising and fever curve trending up, no antibiotics at this   PTA Medication Issues: Home medications not ordered:  Zoloft, trazodone, atorvastatin, hemocyte  Best Practices: DVT Prophylaxsis fondaparinux + warfarin  Goal of Therapy:  INR 2-3   Plan:  1. No warfarin today 2. Daily  INR 3. Continue fondaparinux d#3/5 day minimum overlap with warfarin in the setting of active clots in patient with clotting disorder.  Patient Measurements: Height: 5\' 10"  (177.8 cm) Weight: 180 lb 1.9 oz (81.7 kg) IBW/kg (Calculated) : 73   Vital Signs: Temp: 97.7 F (36.5 C) (01/26 0351) Temp src: Oral (01/26 0351) BP: 111/59 mmHg (01/26 0351) Pulse Rate: 114  (01/26 0600)  Labs:  Basename 11/13/11 0500 11/12/11 0508 11/11/11 1800 11/11/11 1100 11/11/11 0800 11/11/11 0430 11/10/11 1600  HGB 8.6* 9.4* -- -- -- -- --  HCT 25.2* 28.1* -- -- 30.0* -- --  PLT 210 155 -- -- 149* -- --  APTT -- -- -- -- -- -- 53*  LABPROT 30.3* 20.3* -- -- -- 18.1* --  INR 2.84* 1.70* -- -- -- 1.47 --  HEPARINUNFRC -- -- -- -- -- -- --  CREATININE 0.73 0.85 -- -- 0.85 -- --  CKTOTAL -- 402* 541* 427* -- -- --  CKMB -- 4.3* 3.5 2.7 -- -- --  TROPONINI -- <0.30 <0.30 <0.30 -- -- --   Estimated Creatinine Clearance: 96.3 ml/min (by C-G formula based on Cr of 0.73).  Medical History: Past Medical History  Diagnosis Date  . Clotting disorder   . Hyperlipidemia   . Degenerative disc disease   . Peripheral arterial disease   . Leg fracture, left   . Leg fracture, right   . PVD (peripheral vascular disease)   . COPD (chronic obstructive pulmonary disease)   . Anxiety   . Depression     Medications:  Prescriptions prior to admission  Medication Sig Dispense Refill  .  albuterol (PROVENTIL HFA;VENTOLIN HFA) 108 (90 BASE) MCG/ACT inhaler Inhale 2 puffs into the lungs every 6 (six) hours as needed. For shortness of breath.       Marland Kitchen atorvastatin (LIPITOR) 40 MG tablet Take 40 mg by mouth daily.        . budesonide-formoterol (SYMBICORT) 160-4.5 MCG/ACT inhaler Inhale 2 puffs into the lungs 2 (two) times daily.        . Calcium-Vitamin D-Vitamin K [phone] MG-UNT-MCG CHEW Chew 2 tablets by mouth daily.        Marland Kitchen CIALIS 20 MG tablet Take 20 mg by mouth daily as needed. For erectile dysfunction.       . ferrous fumarate (HEMOCYTE - 106 MG FE) 325 (106 FE) MG TABS Take 1 tablet by mouth daily.        . sertraline (ZOLOFT) 50 MG tablet Take 50 mg by mouth daily.        Marland Kitchen tiotropium (SPIRIVA) 18 MCG inhalation capsule Place 18 mcg into inhaler and inhale daily.        . traZODone (DESYREL) 50 MG tablet Take 25 mg by mouth at bedtime. 1/2 tablet at night      . warfarin (COUMADIN) 1 MG tablet Take 2 mg by mouth 2 (two) times a week. Monday and Wednesday.      . warfarin (COUMADIN) 3 MG tablet Take 3 mg by mouth See admin instructions. Sunday, Tuesday, Thursday, Friday and saturday       Scheduled:     . ipratropium  0.5 mg Nebulization Q6H   And  . albuterol  2.5 mg Nebulization Q6H  . famotidine  20 mg Oral BID  . fondaparinux  7.5 mg Subcutaneous Q24H  . sodium chloride      . warfarin  5 mg Oral ONCE-1800  . DISCONTD: bivalirudin  0.75 mg/kg Intravenous Once  . DISCONTD: famotidine (PEPCID) IV  20 mg Intravenous Q12H    Lovenia Kim Pharm.D., BCPS 11/13/2011 7:50 AM [phone]

## 2011-11-13 NOTE — Progress Notes (Signed)
Wife attempted to call this am in reguard to pt being placed on Bipap during the night. Message was left on answering machine. Also attempted to call cell phone listed in computer with no success. Currently wife observed to be upset at [facility] wife cell- phone number placed as sticky note in computer. RN communicated with pt during attempts to contact family with update, stated not to cont calling, family would be here soon.

## 2011-11-13 NOTE — Progress Notes (Signed)
Subjective: Pt had episode of increased sob this am around 5am.  Treated with bilevel, additional neb rx.  Cxr shows no acute process.  He has since recovered, and now looks comfortable on 3 liters with sats 93-98% during my visit.  No chest pain, cough with purulence.    Objective: Vital signs in last 24 hours: Blood pressure 116/55, pulse 95, temperature 97.9 F (36.6 C), temperature source Oral, resp. rate 19, height 5\' 10"  (1.778 m), weight 81.7 kg (180 lb 1.9 oz), SpO2 94.00%.  Intake/Output from previous day: 01/25 0701 - 01/26 0700 In: 610 [I.V.:440; NG/GT:70; IV Piggyback:100] Out: 1380 [Urine:1380]   Physical Exam:   wd male in nad Chest with good airflow, no wheezing, no rhonchi Cor with rrr abd benign Alert, oriented, moves all 4.    Lab Results:  Basename 11/13/11 0500 11/12/11 0508 11/11/11 0800  WBC 15.6* 14.8* 12.0*  HGB 8.6* 9.4* 10.1*  HCT 25.2* 28.1* 30.0*  PLT 210 155 149*   BMET  Basename 11/13/11 0500 11/12/11 0508 11/11/11 0800  NA 145 139 139  K 3.0* 3.7 3.9  CL 109 106 108  CO2 28 25 24   GLUCOSE 123* 152* 148*  BUN 18 15 13   CREATININE 0.73 0.85 0.85  CALCIUM 8.6 8.1* 7.5*    Studies/Results: Dg Chest Port 1 View  11/13/2011  *RADIOLOGY REPORT*  Clinical Data: Atelectasis, respiratory distress  PORTABLE CHEST - 1 VIEW  Comparison: 11/11/2011; 11/10/2011; 11/02/2011  Findings:  Unchanged cardiac silhouette and mediastinal contours.  Improved aeration of the lungs with near resolution of bibasilar heterogeneous opacities.  No new focal airspace opacities.  An enteric tube terminates inferior to the left hemidiaphragm. Interval removal of right jugular approach PA catheter with vascular sheath tip overlying the mid SVC.  No pneumothorax. Grossly unchanged bones.  IMPRESSION: Improved lung volumes with near resolution of basilar atelectasis. No new focal airspace opacities.  Original Report Authenticated By: Waynard Reeds, M.D.     Assessment/Plan: Patient Active Hospital Problem List:  Acute respiratory failure post vascular bypass secondary to underlying copd.   The pt had an episode this am that was transient, and really unclear what caused it.  He currently is doing well on 3 lpm with adequate sats.  He has no bronchospasm on exam, and cxr is improved (actually is clear).  Nothing to suggest PE, and he is already on anticoagulation. Will add budesonide to his BD regimen (was on ICS as outpt), and continue to monitor.     Barbaraann Share, M.D. 11/13/2011, 2:47 PM

## 2011-11-13 NOTE — Progress Notes (Signed)
Pt moved from bed to chair; labored breathing began; resp therapist and rapid response nurse notified; MD notified; new orders received for chest xray, abg, and bipap; will continue to monitor

## 2011-11-13 NOTE — Progress Notes (Addendum)
[facility] Progress Note  11/13/2011 8:35 AM POD 3  States he had some respiratory issues this morning when he was getting out of bed.  Sates he has had some flatus, but no BM.  Denies N/V.  Nurse states pt had resp distress last night and required BiPap.  Afebrile x 24hrs 110-120s sys 96% 4LO2NC  Hr 90S-110S REG Filed Vitals:   11/13/11 0742  BP: 116/55  Pulse: 95  Temp: 97.9 F (36.6 C)  Resp: 19    Incisions:  Laparotomy incision c/d/i with staples in tact and no drainage.  Bilateral groin incisions are c/d/i without drainage. Extremities:  +doppler signal of the DP bilaterally Abdomen:  Soft; slightly distended.  +BS; +flauts; -BM Cardiac:  RRR Lungs:  Decreased throughout, but CTAB  CBC    Component Value Date/Time   WBC 15.6* 11/13/2011 0500   WBC 6.4 09/15/2010 1002   RBC 2.75* 11/13/2011 0500   RBC 4.32 09/15/2010 1002   HGB 8.6* 11/13/2011 0500   HGB 13.0 09/15/2010 1002   HCT 25.2* 11/13/2011 0500   HCT 39.1 09/15/2010 1002   PLT 210 11/13/2011 0500   PLT 175 09/15/2010 1002   MCV 91.6 11/13/2011 0500   MCV 90.5 09/15/2010 1002   MCH 31.3 11/13/2011 0500   MCH 30.1 09/15/2010 1002   MCHC 34.1 11/13/2011 0500   MCHC 33.2 09/15/2010 1002   RDW 13.7 11/13/2011 0500   RDW 13.4 09/15/2010 1002   LYMPHSABS 2.0 11/02/2011 1008   LYMPHSABS 1.9 09/15/2010 1002   MONOABS 0.8 11/02/2011 1008   MONOABS 0.8 09/15/2010 1002   EOSABS 0.1 11/02/2011 1008   EOSABS 0.2 09/15/2010 1002   BASOSABS 0.1 11/02/2011 1008   BASOSABS 0.1 09/15/2010 1002    BMET    Component Value Date/Time   NA 145 11/13/2011 0500   K 3.0* 11/13/2011 0500   CL 109 11/13/2011 0500   CO2 28 11/13/2011 0500   GLUCOSE 123* 11/13/2011 0500   BUN 18 11/13/2011 0500   CREATININE 0.73 11/13/2011 0500   CALCIUM 8.6 11/13/2011 0500   GFRNONAA >90 11/13/2011 0500   GFRAA >90 11/13/2011 0500    INR    Component Value Date/Time   INR 2.84* 11/13/2011 0500   INR 2.10 09/15/2010 1002      Intake/Output Summary (Last 24 hours) at 11/13/11 0835 Last data filed at 11/13/11 0800  Gross per 24 hour  Intake    560 ml  Output   1350 ml  Net   -790 ml     Assessment/Plan:  66 y.o. male is s/p Aortobifemoral BPG POD 3 -respiratory distress last pm--rapid response was called and he was seen by Dr. Tiburcio Pea last pm.  CCM should see pt this am. -Acute surgical blood loss anemia--Hgb down from yesterday.  BP tolerating.  Check stool guiac.  Check labs in am.  Pt on arixtra 7.5mg  qday. -INR up from 1.7 to 2.84 today.  ? D/c arixtra--per pharmacy. -WBC slightly up today from yesterday--15.6 from 14.8 -hypokalemia--supplement with po K+ elixir.  -continue pulmonary toilet/IS -continue mobilization  - Advance diet to clear liquids.  Nurse states that the pt has been eating a large amt of ice chips, which was increasing the NGT output and the reading was inaccurate and that is why there is no NGT output on the I/O's.  Given this information and the pt has not had any N/V, will go ahead and advance diet to clear liquids. -keep in 3300  Newton Pigg, PA-C [facility] [phone] 11/13/2011 8:35 AM           I have examined the patient, reviewed and agree with above. Some flatus.  Will dc ng tube.  Pulm toilet  EARLY,TODD F, MD 11/13/2011 9:43 AM

## 2011-11-13 NOTE — Progress Notes (Signed)
RT placed patient on bipap 10/5 40%. Patient is tolerating settings at this time. RT will continue to monitor.

## 2011-11-14 LAB — CBC
HCT: 22.7 % — ABNORMAL LOW (ref 39.0–52.0)
Hemoglobin: 7.7 g/dL — ABNORMAL LOW (ref 13.0–17.0)
MCH: 31.2 pg (ref 26.0–34.0)
MCHC: 33.9 g/dL (ref 30.0–36.0)
MCV: 91.9 fL (ref 78.0–100.0)
Platelets: 227 K/uL (ref 150–400)
RBC: 2.47 MIL/uL — ABNORMAL LOW (ref 4.22–5.81)
RDW: 13.7 % (ref 11.5–15.5)
WBC: 10.7 K/uL — ABNORMAL HIGH (ref 4.0–10.5)

## 2011-11-14 LAB — TYPE AND SCREEN
ABO/RH(D): B POS
Unit division: 0
Unit division: 0
Unit division: 0
Unit division: 0

## 2011-11-14 LAB — OCCULT BLOOD X 1 CARD TO LAB, STOOL: Fecal Occult Bld: NEGATIVE

## 2011-11-14 LAB — BASIC METABOLIC PANEL
BUN: 19 mg/dL (ref 6–23)
CO2: 30 mEq/L (ref 19–32)
Chloride: 106 mEq/L (ref 96–112)
GFR calc non Af Amer: >90 mL/min (ref >90)
Glucose, Bld: 174 mg/dL — ABNORMAL HIGH (ref 70–99)
Potassium: 2.8 mEq/L — ABNORMAL LOW (ref 3.5–5.1)
Sodium: 143 mEq/L (ref 135–145)

## 2011-11-14 LAB — PROTIME-INR
INR: 3.53 — ABNORMAL HIGH (ref 0.00–1.49)
Prothrombin Time: 35.9 seconds — ABNORMAL HIGH (ref 11.6–15.2)

## 2011-11-14 LAB — PREPARE RBC (CROSSMATCH)

## 2011-11-14 MED ORDER — ALBUTEROL SULFATE HFA 108 (90 BASE) MCG/ACT IN AERS
2.0000 | INHALATION_SPRAY | Freq: Four times a day (QID) | RESPIRATORY_TRACT | Status: DC | PRN
  Filled 2011-11-14: qty 6.7

## 2011-11-14 MED ORDER — SIMVASTATIN 40 MG PO TABS
40.0000 mg | ORAL_TABLET | Freq: Every day | ORAL | Status: DC
  Administered 2011-11-14 – 2011-11-17 (×4): 40 mg via ORAL
  Filled 2011-11-14 (×5): qty 1

## 2011-11-14 MED ORDER — TRAZODONE 25 MG HALF TABLET: 25.0000 mg | ORAL_TABLET | Freq: Every day | ORAL | Status: DC

## 2011-11-14 MED ORDER — TIOTROPIUM BROMIDE MONOHYDRATE 18 MCG IN CAPS
18.0000 ug | ORAL_CAPSULE | Freq: Every day | RESPIRATORY_TRACT | Status: DC
  Administered 2011-11-14 – 2011-11-18 (×5): 18 ug via RESPIRATORY_TRACT
  Filled 2011-11-14: qty 5

## 2011-11-14 MED ORDER — FERROUS FUMARATE 325 (106 FE) MG PO TABS
1.0000 | ORAL_TABLET | Freq: Every day | ORAL | Status: DC
  Administered 2011-11-14 – 2011-11-18 (×5): 106 mg via ORAL
  Filled 2011-11-14 (×5): qty 1

## 2011-11-14 MED ORDER — POTASSIUM CHLORIDE 20 MEQ/15ML (10%) PO LIQD
40.0000 meq | Freq: Two times a day (BID) | ORAL | Status: AC
  Administered 2011-11-14 (×2): 40 meq via ORAL
  Filled 2011-11-14 (×2): qty 30

## 2011-11-14 MED ORDER — BISACODYL 10 MG RE SUPP
10.0000 mg | Freq: Once | RECTAL | Status: AC
  Administered 2011-11-14: 10 mg via RECTAL

## 2011-11-14 MED ORDER — ALBUTEROL SULFATE (5 MG/ML) 0.5% IN NEBU
2.5000 mg | INHALATION_SOLUTION | Freq: Three times a day (TID) | RESPIRATORY_TRACT | Status: DC
  Administered 2011-11-14 – 2011-11-18 (×12): 2.5 mg via RESPIRATORY_TRACT
  Filled 2011-11-14 (×13): qty 0.5

## 2011-11-14 MED ORDER — BUDESONIDE-FORMOTEROL FUMARATE 160-4.5 MCG/ACT IN AERO
2.0000 | INHALATION_SPRAY | Freq: Two times a day (BID) | RESPIRATORY_TRACT | Status: DC
  Administered 2011-11-14 – 2011-11-18 (×8): 2 via RESPIRATORY_TRACT
  Filled 2011-11-14 (×2): qty 6

## 2011-11-14 MED ORDER — LABETALOL HCL 5 MG/ML IV SOLN
10.0000 mg | INTRAVENOUS | Status: DC | PRN
  Filled 2011-11-14: qty 4

## 2011-11-14 MED ORDER — SERTRALINE HCL 50 MG PO TABS
50.0000 mg | ORAL_TABLET | Freq: Every day | ORAL | Status: DC
  Administered 2011-11-14 – 2011-11-18 (×5): 50 mg via ORAL
  Filled 2011-11-14 (×5): qty 1

## 2011-11-14 MED ORDER — FUROSEMIDE 10 MG/ML IJ SOLN
20.0000 mg | Freq: Once | INTRAMUSCULAR | Status: AC
  Administered 2011-11-14: 20 mg via INTRAVENOUS
  Filled 2011-11-14: qty 2

## 2011-11-14 NOTE — Progress Notes (Signed)
ANTICOAGULATION CONSULT NOTE - Follow Up Consult  Pharmacy Consult for Coumadin Indication: "clotting disorder", PVD  Allergies  Allergen Reactions  . Ciprofloxacin Hcl Shortness Of Breath and Rash    fever  . Vancomycin Shortness Of Breath and Rash    fever  . Heparin Other (See Comments)    Blood-clots and bleeding.   Admit Complaint: 66 y/o male patient on chronic coumadin for h/o PVD and lupus anticoagulant and history of HITT discovered when patient experienced rapid clot formation hours after vascular surgery in February of 2011.  At that time SRA HITT panel was negative but patient was found to be lupus anticoagulant positive.  Per records patient was followed by a hematologist in [place] and at some point later found to be SRA HITT positive.   Pharmacy consulted to resume coumadin with arixtra s/p aortobifemoral bypass on 1/23.  Last warfarin dose prior to admission 1/18, was on 3 mg daily except 2 mg Monday and Wednesday.   Assessment: Ongoing rapid rise of INR in last 24h after only two doses of 5 mg warfarin and no dose yesterday.  Given extent of hypercoagulability consider minimum 5 days of overlap of fondaparinux with warfarin.   Spoke with Dr. Arbie Cookey who is not concerned about active thrombus presently.  However hemoglobin has dropped to 7.7 today.  Dr. Arbie Cookey will evaluate weather to give Arixtra dose today.  Will give no warfarin dose today.    Anticoagulation: Lupus anticoagulant and history of HITT on warfarin and fondaparinux (Day 4/5 day overlap)  Cardiovascular: peripheral vascular disease, hyperlipidemia: post-op, consider resuming statin.  Neurology: Anxiety, Depression: consider resuming home medications, zoloft and trazodone  Pulmonary: COPD, increased work of breathing this am, with rapid response call and BiPap, on albuterol and atrovent nebs. WBC rising and fever curve trending up, no antibiotics at this   PTA Medication Issues: Home medications not  ordered:  Zoloft, trazodone, atorvastatin, hemocyte  Best Practices: DVT Prophylaxsis fondaparinux + warfarin  Goal of Therapy:  INR 2-3   Plan:  1. No warfarin today 2. Daily INR 3. Dr Arbie Cookey to evaluate and determine plan for continuing fondaparinux for 5 day minimum overlap (todays dose would be day 4)  in patient with clotting disorder. 4. Given fluctuation in INR will attempt to contact patients hematologist to determine plan for chronic anticoagulation.   Patient Measurements: Height: 5\' 10"  (177.8 cm) Weight: 180 lb 1.9 oz (81.7 kg) IBW/kg (Calculated) : 73   Vital Signs: Temp: 98.4 F (36.9 C) (01/27 0801) Temp src: Oral (01/27 0801) BP: 114/44 mmHg (01/27 0330) Pulse Rate: 79  (01/27 0330)  Labs:  Basename 11/14/11 0630 11/13/11 0500 11/12/11 0508 11/11/11 1800 11/11/11 1100  HGB 7.7* 8.6* -- -- --  HCT 22.7* 25.2* 28.1* -- --  PLT 227 210 155 -- --  APTT -- -- -- -- --  LABPROT 35.9* 30.3* 20.3* -- --  INR 3.53* 2.84* 1.70* -- --  HEPARINUNFRC -- -- -- -- --  CREATININE 0.68 0.73 0.85 -- --  CKTOTAL -- -- 402* 541* 427*  CKMB -- -- 4.3* 3.5 2.7  TROPONINI -- -- <0.30 <0.30 <0.30   Estimated Creatinine Clearance: 96.3 ml/min (by C-G formula based on Cr of 0.68).  Medical History: Past Medical History  Diagnosis Date  . Clotting disorder   . Hyperlipidemia   . Degenerative disc disease   . Peripheral arterial disease   . Leg fracture, left   . Leg fracture, right   . PVD (peripheral  vascular disease)   . COPD (chronic obstructive pulmonary disease)   . Anxiety   . Depression     Medications:  Prescriptions prior to admission  Medication Sig Dispense Refill  . albuterol (PROVENTIL HFA;VENTOLIN HFA) 108 (90 BASE) MCG/ACT inhaler Inhale 2 puffs into the lungs every 6 (six) hours as needed. For shortness of breath.       Marland Kitchen atorvastatin (LIPITOR) 40 MG tablet Take 40 mg by mouth daily.        . budesonide-formoterol (SYMBICORT) 160-4.5 MCG/ACT inhaler  Inhale 2 puffs into the lungs 2 (two) times daily.        . Calcium-Vitamin D-Vitamin K [phone] MG-UNT-MCG CHEW Chew 2 tablets by mouth daily.        Marland Kitchen CIALIS 20 MG tablet Take 20 mg by mouth daily as needed. For erectile dysfunction.      . ferrous fumarate (HEMOCYTE - 106 MG FE) 325 (106 FE) MG TABS Take 1 tablet by mouth daily.        . sertraline (ZOLOFT) 50 MG tablet Take 50 mg by mouth daily.        Marland Kitchen tiotropium (SPIRIVA) 18 MCG inhalation capsule Place 18 mcg into inhaler and inhale daily.        . traZODone (DESYREL) 50 MG tablet Take 25 mg by mouth at bedtime. 1/2 tablet at night      . warfarin (COUMADIN) 1 MG tablet Take 2 mg by mouth 2 (two) times a week. Monday and Wednesday.      . warfarin (COUMADIN) 3 MG tablet Take 3 mg by mouth See admin instructions. Sunday, Tuesday, Thursday, Friday and saturday       Scheduled:     . albuterol  2.5 mg Nebulization TID  . budesonide-formoterol  2 puff Inhalation BID  . famotidine  20 mg Oral BID  . ferrous fumarate  1 tablet Oral Daily  . fondaparinux  7.5 mg Subcutaneous Q24H  . furosemide  20 mg Intravenous Once  . furosemide  20 mg Intravenous Once  . labetalol  10 mg Intravenous Q12H  . potassium chloride  20 mEq Oral Once  . potassium chloride  40 mEq Oral BID  . sertraline  50 mg Oral Daily  . simvastatin  40 mg Oral q1800  . tiotropium  18 mcg Inhalation Daily  . traZODone  25 mg Oral QHS  . DISCONTD: albuterol  2.5 mg Nebulization Q6H  . DISCONTD: budesonide  0.5 mg Nebulization BID  . DISCONTD: ipratropium  0.5 mg Nebulization Q6H  . DISCONTD: traZODone  25 mg Oral QHS  . DISCONTD: traZODone  50 mg Oral QHS    Lovenia Kim Pharm.D., BCPS 11/13/2011 7:50 AM [phone]

## 2011-11-14 NOTE — Progress Notes (Signed)
Pt received 2 unit of PRBC's per protocol. Tolerated fine. Vital signs stable transfusion done at 1732.. Will continue to monitor.

## 2011-11-14 NOTE — Progress Notes (Signed)
Patient transferred today to [address] from [address]. Noted patient is on droplet and contact precautions. Contact precautions were initiated for positive MRSA infection within the last 30 days (even though pre-surgical MRSA PCR was negative) as per protocol. Rationale for droplet precautions unclear to author. Called MD on-call to verify need for droplet precautions. MD discontinued droplet precautions, continued contact precautions. Change in precautions explained to pt and wife. Harlow Asa

## 2011-11-14 NOTE — Progress Notes (Signed)
Respiratory Therpay Note- Bipap removed from patients room with transfer orders to 2000. Report given to new therapist covering that unit.

## 2011-11-14 NOTE — Progress Notes (Addendum)
[facility] Progress Note  11/14/2011 8:06 AM POD 4  States he had a little bit of vomiting after drinking ginger ale yesterday.  Feeling a bit better this am.  Breathing is better this am.  Afebrile  95%4LO2NC   Filed Vitals:   11/14/11 0330  BP: 114/44  Pulse: 79  Temp: 97.8 F (36.6 C)  Resp: 18    Incisions:  C/d/i Extremities:  +doppler DP signals bilaterally Abdomen:  Soft; NT/ND minimal BS; minimal flatus; no BM Cardiac:  RRR Lungs:  CTAB  CBC    Component Value Date/Time   WBC 10.7* 11/14/2011 0630   WBC 6.4 09/15/2010 1002   RBC 2.47* 11/14/2011 0630   RBC 4.32 09/15/2010 1002   HGB 7.7* 11/14/2011 0630   HGB 13.0 09/15/2010 1002   HCT 22.7* 11/14/2011 0630   HCT 39.1 09/15/2010 1002   PLT 227 11/14/2011 0630   PLT 175 09/15/2010 1002   MCV 91.9 11/14/2011 0630   MCV 90.5 09/15/2010 1002   MCH 31.2 11/14/2011 0630   MCH 30.1 09/15/2010 1002   MCHC 33.9 11/14/2011 0630   MCHC 33.2 09/15/2010 1002   RDW 13.7 11/14/2011 0630   RDW 13.4 09/15/2010 1002   LYMPHSABS 2.0 11/02/2011 1008   LYMPHSABS 1.9 09/15/2010 1002   MONOABS 0.8 11/02/2011 1008   MONOABS 0.8 09/15/2010 1002   EOSABS 0.1 11/02/2011 1008   EOSABS 0.2 09/15/2010 1002   BASOSABS 0.1 11/02/2011 1008   BASOSABS 0.1 09/15/2010 1002    BMET    Component Value Date/Time   NA 143 11/14/2011 0630   K 2.8* 11/14/2011 0630   CL 106 11/14/2011 0630   CO2 30 11/14/2011 0630   GLUCOSE 174* 11/14/2011 0630   BUN 19 11/14/2011 0630   CREATININE 0.68 11/14/2011 0630   CALCIUM 8.2* 11/14/2011 0630   GFRNONAA >90 11/14/2011 0630   GFRAA >90 11/14/2011 0630    INR    Component Value Date/Time   INR 3.53* 11/14/2011 0630   INR 2.10 09/15/2010 1002     Intake/Output Summary (Last 24 hours) at 11/14/11 0806 Last data filed at 11/14/11 0600  Gross per 24 hour  Intake    920 ml  Output    275 ml  Net    645 ml     Assessment/Plan:  66 y.o. male is s/p Aortobifemoral BPG POD 4 -acute  surgical blood loss anemia--Hgb 7.7 this am...will transfuse 2 PRBCs with diuresis in b/w units. -ck labs in am -small amount of vomiting after ginger ale yesterday, will not advance diet today -no resp events since yesterday morning. -hypokalemia--supplement with liquid potassium 40 mEq bid x 2 doses. Re-order home meds. -guiac not done d/t pt not having a BM -INR is 3.53 today and pt is still receiving arixtra.  Do not give arixtra today as pt Hgb is dropping and pt's INR is supra therapeutic.    Newton Pigg, PA-C [facility] [phone] 11/14/2011 8:06 AM           I have examined the patient, reviewed and agree with above. Doing well with liquids this morning. Will give Dulcolax suppository. Will hold Arixtra dropping hemoglobin and hematocrit and therapeutic INR. Agree with transfusion for postoperative anemia. He'll transfer to 2000.  Larina Earthly, MD 11/14/2011 9:34 AM

## 2011-11-14 NOTE — Progress Notes (Signed)
Pt  tx to 2000 per MD order, Pt VSS, pt wife communicated with about tx, orders communicated to RN, pt verbalized understanding of tx, eye glasses tx with pt placed on pt face, wife called after tx by tx RN and updates were given

## 2011-11-15 LAB — TYPE AND SCREEN
ABO/RH(D): B POS
Antibody Screen: NEGATIVE
Unit division: 0
Unit division: 0

## 2011-11-15 LAB — BASIC METABOLIC PANEL
Calcium: 8.6 mg/dL (ref 8.4–10.5)
Creatinine, Ser: 0.72 mg/dL (ref 0.50–1.35)
GFR calc non Af Amer: >90 mL/min (ref >90)
Glucose, Bld: 113 mg/dL — ABNORMAL HIGH (ref 70–99)
Sodium: 142 mEq/L (ref 135–145)

## 2011-11-15 LAB — PROTIME-INR: Prothrombin Time: 39 seconds — ABNORMAL HIGH (ref 11.6–15.2)

## 2011-11-15 LAB — CBC
Hemoglobin: 10.3 g/dL — ABNORMAL LOW (ref 13.0–17.0)
MCH: 30.7 pg (ref 26.0–34.0)
MCHC: 33.3 g/dL (ref 30.0–36.0)
MCV: 92.2 fL (ref 78.0–100.0)

## 2011-11-15 NOTE — Progress Notes (Signed)
PCCM FOLLOWUP NOTE   Subjective: Feels well. Feels no  More dyspnea episodes. Continues to be on o2 though (says baseline is severe-very severe). No chest pain, cough with purulence.    Objective: Vital signs in last 24 hours: Blood pressure 108/63, pulse 67, temperature 97.1 F (36.2 C), temperature source Oral, resp. rate 19, height 5\' 10"  (1.778 m), weight 81.7 kg (180 lb 1.9 oz), SpO2 94.00%.  Intake/Output from previous day: 01/27 0701 - 01/28 0700 In: 450 [I.V.:125; Blood:325] Out: -    Physical Exam: BP 108/63  Pulse 67  Temp(Src) 97.1 F (36.2 C) (Oral)  Resp 19  Ht 5\' 10"  (1.778 m)  Wt 81.7 kg (180 lb 1.9 oz)  BMI 25.84 kg/m2  SpO2 94%  General Appearance:    Alert, cooperative, no distress, appears stated age. Chronically unwell  Head:    Normocephalic, without obvious abnormality, atraumatic  Eyes:    PERRL, conjunctiva/corneas clear, EOM's intact, fundi    benign, both eyes       Ears:    Normal TM's and external ear canals, both ears  Nose:   Nares normal, septum midline, mucosa normal, no drainage   or sinus tenderness  Throat:   Lips, mucosa, and tongue normal; teeth and gums normal  Neck:   Supple, symmetrical, trachea midline, no adenopathy;       thyroid:  No enlargement/tenderness/nodules; no carotid   bruit or JVD  Back:     Symmetric, no curvature, ROM normal, no CVA tenderness  Lungs:     Clear to auscultation bilaterally, respirations unlabored. Prolonged expiration. Speaks full sentences. Purse lip breathing +. No wheeze. No distress  Chest wall:    Barrell Shape chest +  Heart:    Regular rate and rhythm, S1 and S2 normal, no murmur, rub   or gallop  Abdomen:     Soft, non-tender, bowel sounds active all four quadrants,    no masses, no organomegaly  Genitalia:    Normal male without lesion, discharge or tenderness  Rectal:    Normal tone, normal prostate, no masses or tenderness;   guaiac negative stool  Extremities:   Extremities normal,  atraumatic, no cyanosis or edema  Pulses:   2+ and symmetric all extremities  Skin:   Skin color, texture, turgor normal, no rashes or lesions  Lymph nodes:   Cervical, supraclavicular, and axillary nodes normal  Neurologic:   CNII-XII intact. Normal strength, sensation and reflexes      throughout       Lab Results:  Basename 11/15/11 0615 11/14/11 0630 11/13/11 0500  WBC 11.9* 10.7* 15.6*  HGB 10.3* 7.7* 8.6*  HCT 30.9* 22.7* 25.2*  PLT 216 227 210   BMET  Basename 11/15/11 0615 11/14/11 0630 11/13/11 0500  NA 142 143 145  K 3.5 2.8* 3.0*  CL 106 106 109  CO2 29 30 28   GLUCOSE 113* 174* 123*  BUN 17 19 18   CREATININE 0.72 0.68 0.73  CALCIUM 8.6 8.2* 8.6    Studies/Results: No results found.  Assessment/Plan: Patient Active Hospital Problem List:  Acute respiratory failure post vascular bypass secondary to underlying copd.   Severe-Very Severe COPD  Doing well since episode 2 days ago. Still on some o2. Continue current regimen of BD . Will continue to follow. Wean fio2 as tolerated.     Dr. Kalman Shan, M.D., [facility].C.P Pulmonary and Critical Care Medicine Staff Physician [facility] Pager: [phone], If no  answer or between  15:00h - 7:00h: call [phone]  11/15/2011 8:46 AM

## 2011-11-15 NOTE — Progress Notes (Addendum)
[facility] [place]  Daily Progress Note  Assessment/Planning: POD #5 s/p Aortobifemoral bypass   Titrate oxygen  Ambulate  PT/OT  Continue holding Coumadin as supratherapeutic  CBC stable with appropriate response to transfusion  Subjective  - 5 Days Post-Op  +BM, pain ok, breathing ok  Objective Filed Vitals:   11/14/11 2118 11/14/11 2129 11/15/11 0621 11/15/11 0807  BP: 103/62  108/63   Pulse: 70  67   Temp: 98.5 F (36.9 C)  97.1 F (36.2 C)   TempSrc: Oral  Oral   Resp: 19  19   Height:      Weight:      SpO2: 93% 92% 98% 94%    Intake/Output Summary (Last 24 hours) at 11/15/11 0816 Last data filed at 11/14/11 1535  Gross per 24 hour  Intake    450 ml  Output      0 ml  Net    450 ml    PULM  B rales, [place] oxygen CV  RRR GI  soft, appropriate TTP, -G/R VASC  B groin c/d/i, B feet warm  Laboratory CBC    Component Value Date/Time   WBC 11.9* 11/15/2011 0615   WBC 6.4 09/15/2010 1002   HGB 10.3* 11/15/2011 0615   HGB 13.0 09/15/2010 1002   HCT 30.9* 11/15/2011 0615   HCT 39.1 09/15/2010 1002   PLT 216 11/15/2011 0615   PLT 175 09/15/2010 1002    BMET    Component Value Date/Time   NA 142 11/15/2011 0615   K 3.5 11/15/2011 0615   CL 106 11/15/2011 0615   CO2 29 11/15/2011 0615   GLUCOSE 113* 11/15/2011 0615   BUN 17 11/15/2011 0615   CREATININE 0.72 11/15/2011 0615   CALCIUM 8.6 11/15/2011 0615   GFRNONAA >90 11/15/2011 0615   GFRAA >90 11/15/2011 0615   Lab Results  Component Value Date   INR 3.93* 11/15/2011   INR 3.53* 11/14/2011   INR 2.84* 11/13/2011   PROTIME 25.2* 09/15/2010   PROTIME 28.8* 09/09/2010   PROTIME 24.0* 09/03/2010    Leonides Sake, MD [facility] [place] Office: [phone] Pager: [phone]  11/15/2011, 8:16 AM

## 2011-11-15 NOTE — Progress Notes (Signed)
ANTICOAGULATION CONSULT NOTE - Follow Up Consult  Pharmacy Consult for Coumadin Indication: "clotting disorder", PVD  Allergies  Allergen Reactions  . Ciprofloxacin Hcl Shortness Of Breath and Rash    fever  . Vancomycin Shortness Of Breath and Rash    fever  . Heparin Other (See Comments)    Blood-clots and bleeding.   Assessment: 66 y/o male patient on chronic coumadin for h/o PVD and lupus anticoagulant and history of HITT discovered when patient experienced rapid clot formation hours after vascular surgery in February of 2011.  At that time SRA HITT panel was negative but patient was found to be lupus anticoagulant positive.  Per records patient was followed by a hematologist in [place] and at some point later fount to be SRA HITT positive.   Pharmacy consulted to resume coumadin with arixtra s/p aortobifemoral bypass on 1/23.  Last warfarin dose prior to admission 1/18, was on 3 mg daily except 2 mg Monday and Wednesday.  Rapid rise of INR in last 24h after only two doses of warfarin, and INR today still above desired goal range.  Given extent of hypercoagulability consider minimum 5 days of overlap of fondaparinux with warfarin.  (Noted Fondaparinux now discontinued)   Will give no warfarin dose today.   No bleeding noted. CBC improved with H/H 10.3/30.9.  Platelets 216K.     Pulmonary: COPD, increased work of breathing this am, with rapid response call and BiPap, on albuterol and tiotropium nebs as well as oxygen.   Best Practices: DVT Prophylaxsis warfarin  Goal of Therapy:  INR 2-3   Plan:  1. No warfarin today 2. Daily INR  Patient Measurements: Height: 5\' 10"   Weight: 180 lb 1.9 oz  IBW/kg (Calculated) : 73 kg  Vital Signs: Temp: 97.1 F (36.2 C) (01/28 0621) Temp src: Oral (01/28 0621) BP: 98/63 mmHg (01/28 1041) Pulse Rate: 70  (01/28 1041)  Labs:  Basename 11/15/11 0615 11/14/11 0630 11/13/11 0500  HGB 10.3* 7.7* --  HCT 30.9* 22.7* 25.2*  PLT 216  227 210  APTT -- -- --  LABPROT 39.0* 35.9* 30.3*  INR 3.93* 3.53* 2.84*  HEPARINUNFRC -- -- --  CREATININE 0.72 0.68 0.73  CKTOTAL -- -- --  CKMB -- -- --  TROPONINI -- -- --   Estimated Creatinine Clearance: 96.3 ml/min (by C-G formula based on Cr of 0.72).  Medical History: Past Medical History  Diagnosis Date  . Clotting disorder   . Hyperlipidemia   . Degenerative disc disease   . Peripheral arterial disease   . Leg fracture, left   . Leg fracture, right   . PVD (peripheral vascular disease)   . COPD (chronic obstructive pulmonary disease)   . Anxiety   . Depression     Medications:  Prescriptions prior to admission  Medication Sig Dispense Refill  . albuterol (PROVENTIL HFA;VENTOLIN HFA) 108 (90 BASE) MCG/ACT inhaler Inhale 2 puffs into the lungs every 6 (six) hours as needed. For shortness of breath.       Marland Kitchen atorvastatin (LIPITOR) 40 MG tablet Take 40 mg by mouth daily.        . budesonide-formoterol (SYMBICORT) 160-4.5 MCG/ACT inhaler Inhale 2 puffs into the lungs 2 (two) times daily.        . Calcium-Vitamin D-Vitamin K [phone] MG-UNT-MCG CHEW Chew 2 tablets by mouth daily.        Marland Kitchen CIALIS 20 MG tablet Take 20 mg by mouth daily as needed. For erectile dysfunction.      Marland Kitchen  ferrous fumarate (HEMOCYTE - 106 MG FE) 325 (106 FE) MG TABS Take 1 tablet by mouth daily.        . sertraline (ZOLOFT) 50 MG tablet Take 50 mg by mouth daily.        Marland Kitchen tiotropium (SPIRIVA) 18 MCG inhalation capsule Place 18 mcg into inhaler and inhale daily.        . traZODone (DESYREL) 50 MG tablet Take 25 mg by mouth at bedtime. 1/2 tablet at night      . warfarin (COUMADIN) 1 MG tablet Take 2 mg by mouth 2 (two) times a week. Monday and Wednesday.      . warfarin (COUMADIN) 3 MG tablet Take 3 mg by mouth See admin instructions. Sunday, Tuesday, Thursday, Friday and saturday        Nadara Mustard, PharmD., MS Clinical Pharmacist Pager:  [phone]

## 2011-11-15 NOTE — Progress Notes (Signed)
Physical Therapy Treatment Patient Details Name: [name] MRN: [id number] DOB: 11/04/1945 Today's Date: 11/15/2011  PT Assessment/Plan  PT - Assessment/Plan Comments on Treatment Session: Did great today. More limited by decreased activity tolerance at this point. Encouraged smaller walks during the day with staff.  PT Plan: Discharge plan remains appropriate;Frequency remains appropriate Equipment Recommended: Rolling walker with 5" wheels PT Goals  Acute Rehab PT Goals PT Goal: Rolling Supine to Right Side - Progress: Progressing toward goal PT Goal: Rolling Supine to Left Side - Progress: Progressing toward goal PT Goal: Supine/Side to Sit - Progress: Progressing toward goal PT Goal: Sit at Edge Of Bed - Progress: Met PT Goal: Sit to Stand - Progress: Progressing toward goal PT Goal: Stand to Sit - Progress: Progressing toward goal PT Transfer Goal: Bed to Chair/Chair to Bed - Progress: Progressing toward goal PT Goal: Ambulate - Progress: Progressing toward goal  PT Treatment Precautions/Restrictions  Precautions Required Braces or Orthoses: No Restrictions Weight Bearing Restrictions: No Mobility (including Balance) Bed Mobility Rolling Left: 5: Supervision Left Sidelying to Sit: 5: Supervision Left Sidelying to Sit Details (indicate cue type and reason): pt holding pillow; minimal v/cs for sequencing Transfers Sit to Stand: 4: Min assist;From bed;From chair/3-in-1;With upper extremity assist Sit to Stand Details (indicate cue type and reason): cues for technique Stand to Sit: 4: Min assist;With armrests;To chair/3-in-1 Stand to Sit Details: cues for hand placement and technique Ambulation/Gait Ambulation/Gait Assistance: 4: Min assist Ambulation/Gait Assistance Details (indicate cue type and reason): amb with 3-4 standing rest breaks; cueing for diaphragmatic and pursed lip breathing; pt with flexed posture and gets quick/impulsive as fatigue sets in; cues for safe  techniqje/speed Ambulation Distance (Feet): 200 Feet Assistive device: Rolling walker Gait Pattern: Trunk flexed;Shuffle  Posture/Postural Control Posture/Postural Control: No significant limitations Exercise    End of Session PT - End of Session Equipment Utilized During Treatment: Gait belt Activity Tolerance: Patient tolerated treatment well;Patient limited by fatigue (upped his O2 from 2 L->4 during amb. per RN) Patient left: in chair;with call bell in reach;with family/visitor present (encouraged pt sit up for increased tolerance) Nurse Communication: Mobility status for transfers;Mobility status for ambulation General Behavior During Session: [facility] for tasks performed Cognition: [facility] for tasks performed  [facility] [name] 11/15/2011, 1:23 PM

## 2011-11-16 DIAGNOSIS — J449 Chronic obstructive pulmonary disease, unspecified: Secondary | ICD-10-CM

## 2011-11-16 DIAGNOSIS — R0902 Hypoxemia: Secondary | ICD-10-CM

## 2011-11-16 DIAGNOSIS — I70309 Unspecified atherosclerosis of unspecified type of bypass graft(s) of the extremities, unspecified extremity: Secondary | ICD-10-CM

## 2011-11-16 LAB — CBC
HCT: 29.7 % — ABNORMAL LOW (ref 39.0–52.0)
Hemoglobin: 10 g/dL — ABNORMAL LOW (ref 13.0–17.0)
MCH: 30.9 pg (ref 26.0–34.0)
MCHC: 33.7 g/dL (ref 30.0–36.0)
MCV: 91.7 fL (ref 78.0–100.0)
RDW: 13.9 % (ref 11.5–15.5)

## 2011-11-16 LAB — HEPATIC FUNCTION PANEL
Alkaline Phosphatase: 57 U/L (ref 39–117)
Bilirubin, Direct: 0.5 mg/dL — ABNORMAL HIGH (ref 0.0–0.3)
Indirect Bilirubin: 1.3 mg/dL — ABNORMAL HIGH (ref 0.3–0.9)
Total Bilirubin: 1.8 mg/dL — ABNORMAL HIGH (ref 0.3–1.2)

## 2011-11-16 LAB — BASIC METABOLIC PANEL
Calcium: 8.2 mg/dL — ABNORMAL LOW (ref 8.4–10.5)
GFR calc Af Amer: >90 mL/min (ref >90)
GFR calc non Af Amer: >90 mL/min (ref >90)
Potassium: 3 mEq/L — ABNORMAL LOW (ref 3.5–5.1)
Sodium: 139 mEq/L (ref 135–145)

## 2011-11-16 LAB — PROTIME-INR: INR: 3.76 — ABNORMAL HIGH (ref 0.00–1.49)

## 2011-11-16 NOTE — Progress Notes (Signed)
Utilization review completed. [place] Mall, RN, BSN. 11/16/11  

## 2011-11-16 NOTE — Progress Notes (Addendum)
[facility] Progress Note  11/16/2011 7:39 AM POD 6  No complaints this am with exception of annoying alarm at night that kept him awake.  Filed Vitals:   11/16/11 0443  BP: 105/64  Pulse: 75  Temp: 98.4 F (36.9 C)  Resp: 19   Abdomen:  Soft, NT/ND +BS/+BM Cardiac:  RRR Lungs:  CTAB  Incisions:  C/d/i without drainage.  Staples in tact for abdomen. Extremities:  Warm/well perfused.  CBC    Component Value Date/Time   WBC 12.2* 11/16/2011 0600   WBC 6.4 09/15/2010 1002   RBC 3.24* 11/16/2011 0600   RBC 4.32 09/15/2010 1002   HGB 10.0* 11/16/2011 0600   HGB 13.0 09/15/2010 1002   HCT 29.7* 11/16/2011 0600   HCT 39.1 09/15/2010 1002   PLT 193 11/16/2011 0600   PLT 175 09/15/2010 1002   MCV 91.7 11/16/2011 0600   MCV 90.5 09/15/2010 1002   MCH 30.9 11/16/2011 0600   MCH 30.1 09/15/2010 1002   MCHC 33.7 11/16/2011 0600   MCHC 33.2 09/15/2010 1002   RDW 13.9 11/16/2011 0600   RDW 13.4 09/15/2010 1002   LYMPHSABS 2.0 11/02/2011 1008   LYMPHSABS 1.9 09/15/2010 1002   MONOABS 0.8 11/02/2011 1008   MONOABS 0.8 09/15/2010 1002   EOSABS 0.1 11/02/2011 1008   EOSABS 0.2 09/15/2010 1002   BASOSABS 0.1 11/02/2011 1008   BASOSABS 0.1 09/15/2010 1002    BMET    Component Value Date/Time   NA 139 11/16/2011 0600   K 3.0* 11/16/2011 0600   CL 104 11/16/2011 0600   CO2 26 11/16/2011 0600   GLUCOSE 122* 11/16/2011 0600   BUN 14 11/16/2011 0600   CREATININE 0.59 11/16/2011 0600   CALCIUM 8.2* 11/16/2011 0600   GFRNONAA >90 11/16/2011 0600   GFRAA >90 11/16/2011 0600    INR    Component Value Date/Time   INR 3.76* 11/16/2011 0600   INR 2.10 09/15/2010 1002     Intake/Output Summary (Last 24 hours) at 11/16/11 0739 Last data filed at 11/15/11 1900  Gross per 24 hour  Intake    840 ml  Output    652 ml  Net    188 ml     Assessment/Plan:  66 y.o. male is s/p aortobifemoral BPG  POD 6 -tolerating clear liquids--will advance to full liquids today -Hgb continues  to be improved from yesterday -slight increase in WBC, but stable -INR 3.76 today, continue to hold coumadin -continue pulmonary toilet/IS -mobilize/PT/OT -possibly home tomorrow or the next day if he tolerates diet.  Newton Pigg, PA-C [facility] [phone] 11/16/2011 7:39 AM  Addendum  I have independently interviewed and examined the patient, and I agree with the physician assistant's findings.  Incisions all c/d/i.  Stable H/H.  Return of bowel function.  Improving pulm function.  Good mobility per PT.  Home within next few days.  Leonides Sake, MD [facility] [place] Office: [phone] Pager: [phone]  11/16/2011, 11:10 AM

## 2011-11-16 NOTE — Progress Notes (Signed)
Physical Therapy Treatment Patient Details Name: [name] MRN: [id number] DOB: 1946-04-26 Today's Date: 11/16/2011  PT Assessment/Plan  PT - Assessment/Plan Comments on Treatment Session: Moving quickly with decreased activity tolerance and pt getting increased dyspnea. Needs education of self monitoring and rest breaks. Might benefit from pulmonary rehab in the outpatient setting?  PT Plan: Discharge plan remains appropriate;Frequency remains appropriate PT Goals  Acute Rehab PT Goals PT Goal: Rolling Supine to Left Side - Progress: Met PT Goal: Supine/Side to Sit - Progress: Met PT Goal: Sit to Stand - Progress: Progressing toward goal PT Goal: Stand to Sit - Progress: Progressing toward goal PT Transfer Goal: Bed to Chair/Chair to Bed - Progress: Progressing toward goal PT Goal: Ambulate - Progress: Progressing toward goal  PT Treatment Precautions/Restrictions  Precautions Required Braces or Orthoses: No Restrictions Weight Bearing Restrictions: No Mobility (including Balance) Bed Mobility Rolling Left: 6: Modified independent (Device/Increase time) Left Sidelying to Sit: 6: Modified independent (Device/Increase time) Sit to Sidelying Left: 6: Modified independent (Device/Increase time) Transfers Sit to Stand: 5: Supervision;With upper extremity assist;From bed Sit to Stand Details (indicate cue type and reason): cues for safe hand placement and safe speed/technique (pt tends to be a bit impulsive) Stand to Sit: 5: Supervision;With upper extremity assist;With armrests;To chair/3-in-1 Ambulation/Gait Ambulation/Gait Assistance Details (indicate cue type and reason): amb approx 210 ft with RW and mingaurdA; again gets very quick/impulsive and then becomes short of breath. cues for safe technique and slowed controlled breathing during gait Ambulation Distance (Feet): 210 Feet Assistive device: Rolling walker Gait Pattern: Trunk flexed    Exercise    End of Session PT  - End of Session Equipment Utilized During Treatment: Gait belt Activity Tolerance: Patient tolerated treatment well;Treatment limited secondary to medical complications (Comment) (DOE) Patient left: in chair Nurse Communication: Mobility status for transfers;Mobility status for ambulation General Behavior During Session: [facility] for tasks performed Cognition: [facility] for tasks performed  [name] 11/16/2011, 1:05 PM

## 2011-11-16 NOTE — Progress Notes (Signed)
PCCM FOLLOWUP NOTE   Subjective: Feels well. Feels no  More dyspnea episodes. Weaned to 2L O2. Marland Kitchen No chest pain, cough with purulence.    Objective: Vital signs in last 24 hours: Filed Vitals:   11/15/11 1414 11/15/11 2031 11/16/11 0443 11/16/11 0927  BP:  92/53 105/64   Pulse:  74 75   Temp:  98.3 F (36.8 C) 98.4 F (36.9 C)   TempSrc:  Oral Oral   Resp:  18 19   Height:      Weight:      SpO2: 94% 98% 94% 97%     Intake/Output from previous day: 01/28 0701 - 01/29 0700 In: 840 [P.O.:840] Out: 652 [Urine:650; Stool:2]   Physical Exam: BP 105/64  Pulse 75  Temp(Src) 98.4 F (36.9 C) (Oral)  Resp 19  Ht 5\' 10"  (1.778 m)  Wt 180 lb 1.9 oz (81.7 kg)  BMI 25.84 kg/m2  SpO2 97%  EXAM General: wdwn adult male in NAD Neuro: AAOx4, speech clear, MAE CV: s1s2 distant, regular PULM: resp's even/non-labored on 2L, lungs bilaterally diminished but clear GI: round/soft, midline staples Extremities: warm/dry, no edema,  R IJ cordis c/d/i  Lab Results:  Basename 11/16/11 0600 11/15/11 0615 11/14/11 0630  WBC 12.2* 11.9* 10.7*  HGB 10.0* 10.3* 7.7*  HCT 29.7* 30.9* 22.7*  PLT 193 216 227   BMET  Basename 11/16/11 0600 11/15/11 0615 11/14/11 0630  NA 139 142 143  K 3.0* 3.5 2.8*  CL 104 106 106  CO2 26 29 30   GLUCOSE 122* 113* 174*  BUN 14 17 19   CREATININE 0.59 0.72 0.68  CALCIUM 8.2* 8.6 8.2*    Studies/Results: No results found.  Assessment/Plan: Patient Active Hospital Problem List:  Acute respiratory failure post aortobifemoral bypass secondary to underlying severe COPD.   Assessment: Former heavy smoker, quit one year ago.   Plan: -wean O2 to off, may need ambulatory desat check for home O2 needs if unable to wean off -Recommend d/c on symbicort, spiriva with PRN albuterol MDI (Q4 PRN) -push pulmonary hygiene -mobilize / PT -follow up appointment made with Dr. Marchelle Gearing for COPD 2/15 @ 330pm (patient has P[facility]M [facility] and he lives there; he will  decide if he wants to come to [place] or not)  Coagulopathy Assessment: secondary to coumadin post-op aortobifem bypass Plan: -coumadin per pharmacy, currently supratherapeutic  Hypokalemia Assessment: Plan: -per primary SVC   PCCM will sign off.  Follow up as above.  Please call PRN for needs.    Canary Brim, NP-C [facility] Pgr: [phone]  STAFF NOTE: I, Dr Lavinia Sharps have personally reviewed patient's available data, including medical history, events of note, physical examination and test results as part of my evaluation. I have discussed with resident/NP and other care providers such as pharmacist, RN and RRT.  In addition,  I personally evaluated patient and elicited key findings of  Copd (very severe) and cntinued stablity past 24h.  Rest per NP/medical resident whose note is outlined above and that I agree with   Dr. Kalman Shan, M.D., [facility].C.P Pulmonary and Critical Care Medicine Staff Physician [facility] Pager: [phone], If no answer or between  15:00h - 7:00h: call [phone]  11/16/2011 12:06 PM

## 2011-11-16 NOTE — Progress Notes (Signed)
Occupational Therapy Treatment Patient Details Name: [name] MRN: [id number] DOB: [birth year] Today's Date: 11/16/2011  OT Assessment/Plan OT Assessment/Plan OT Plan: Discharge plan remains appropriate Follow Up Recommendations: No OT follow up Equipment Recommended: Rolling walker with 5" wheels OT Goals ADL Goals Pt Will Perform Tub/Shower Transfer: Shower transfer;with modified independence;Shower seat with back;Ambulation ADL Goal: Tub/Shower Transfer - Progress: Updated due to goal met  OT Treatment Pain: Pt. With no c/o pain ADL ADL Tub/Shower Transfer: Performed;Minimal assistance Tub/Shower Transfer Details (indicate cue type and reason): Min A for over ledge. Pt used grab bars which he does not have at home. He does, however, have a built-in shower seat Tub/Shower Transfer Method: Ambulating Tub/Shower Transfer Equipment: Grab bars Ambulation Related to ADLs: Supervision with RW. Pt. 96% on 3L O2. Dropped down to 83% with activity on RA. O2 replaced and sats returned to 95%.  ADL Comments: Pt progressing well, but continues to be limited by pulmonary status Mobility  Bed Mobility Rolling Left: 5: Supervision Left Sidelying to Sit: 5: Supervision End of Session OT - End of Session Equipment Utilized During Treatment: Gait belt Activity Tolerance: Patient tolerated treatment well (limited by pulmonary status) Patient left: in bed;with call bell in reach;with family/visitor present General Behavior During Session: [facility] for tasks performed Cognition: [facility] for tasks performed  [name]  11/16/2011, 8:05 AM

## 2011-11-16 NOTE — Progress Notes (Signed)
ANTICOAGULATION CONSULT NOTE - Follow Up Consult  Pharmacy Consult for Coumadin Indication: "clotting disorder", PVD  Allergies  Allergen Reactions  . Ciprofloxacin Hcl Shortness Of Breath and Rash    fever  . Vancomycin Shortness Of Breath and Rash    fever  . Heparin Other (See Comments)    Blood-clots and bleeding.   Assessment: 66 y/o male patient on chronic coumadin for h/o PVD and lupus anticoagulant and history of HITT discovered when patient experienced rapid clot formation hours after vascular surgery in February of 2011.  At that time SRA HITT panel was negative but patient was found to be lupus anticoagulant positive.  Per records patient was followed by a hematologist in [place] and at some point later fount to be SRA HITT positive.   Pharmacy consulted to resume coumadin with arixtra s/p aortobifemoral bypass on 1/23.  Last warfarin dose prior to admission 1/18, was on 3 mg daily except 2 mg Monday and Wednesday. INR is still elevated today at 3.76.   Goal of Therapy:  INR 2-3   Plan:  1. No warfarin today 2. Daily INR  Patient Measurements: Height: 5\' 10"   Weight: 180 lb 1.9 oz  IBW/kg (Calculated) : 73 kg  Vital Signs: Temp: 98.4 F (36.9 C) (01/29 0443) Temp src: Oral (01/29 0443) BP: 105/64 mmHg (01/29 0443) Pulse Rate: 75  (01/29 0443)  Labs:  Basename 11/16/11 0600 11/15/11 0615 11/14/11 0630  HGB 10.0* 10.3* --  HCT 29.7* 30.9* 22.7*  PLT 193 216 227  APTT -- -- --  LABPROT 37.7* 39.0* 35.9*  INR 3.76* 3.93* 3.53*  HEPARINUNFRC -- -- --  CREATININE 0.59 0.72 0.68  CKTOTAL -- -- --  CKMB -- -- --  TROPONINI -- -- --   Estimated Creatinine Clearance: 96.3 ml/min (by C-G formula based on Cr of 0.59).   Medications:  Scheduled:    . albuterol  2.5 mg Nebulization TID  . budesonide-formoterol  2 puff Inhalation BID  . famotidine  20 mg Oral BID  . ferrous fumarate  1 tablet Oral Daily  . sertraline  50 mg Oral Daily  . simvastatin   40 mg Oral q1800  . tiotropium  18 mcg Inhalation Daily  . traZODone  25 mg Oral QHS  . DISCONTD: labetalol  10 mg Intravenous Q12H     Harland German, Pharm D 11/16/2011 10:44 AM

## 2011-11-16 NOTE — Progress Notes (Signed)
Pt ambulated 50 ft on 2L of O2, pt tolerated activity well and is now resting in bed.   Aidan Caloca [employer]

## 2011-11-17 LAB — PROTIME-INR: INR: 2.96 — ABNORMAL HIGH (ref 0.00–1.49)

## 2011-11-17 MED ORDER — WARFARIN SODIUM 3 MG PO TABS
3.0000 mg | ORAL_TABLET | Freq: Once | ORAL | Status: DC
  Filled 2011-11-17: qty 1

## 2011-11-17 NOTE — Progress Notes (Addendum)
[facility] Progress Note  11/17/2011 8:14 AM POD 7   Tolerated full liquid diet without nausea, just didn't eat a lot of it.  Would like to try to advance to solids today.  Tm 99.7 now 98.3  92%2LO2NC    100-120s sys Filed Vitals:   11/17/11 0418  BP: 122/62  Pulse: 76  Temp: 98.3 F (36.8 C)  Resp: 18    Incisions:  Bilateral groins and abdominal incisions are c/d/i without drainage. Extremities:  BLE warm and well perfused. Cardiac:  RRR Lungs:  CTAB Abdomen: soft NT/ND +BS  CBC    Component Value Date/Time   WBC 12.2* 11/16/2011 0600   WBC 6.4 09/15/2010 1002   RBC 3.24* 11/16/2011 0600   RBC 4.32 09/15/2010 1002   HGB 10.0* 11/16/2011 0600   HGB 13.0 09/15/2010 1002   HCT 29.7* 11/16/2011 0600   HCT 39.1 09/15/2010 1002   PLT 193 11/16/2011 0600   PLT 175 09/15/2010 1002   MCV 91.7 11/16/2011 0600   MCV 90.5 09/15/2010 1002   MCH 30.9 11/16/2011 0600   MCH 30.1 09/15/2010 1002   MCHC 33.7 11/16/2011 0600   MCHC 33.2 09/15/2010 1002   RDW 13.9 11/16/2011 0600   RDW 13.4 09/15/2010 1002   LYMPHSABS 2.0 11/02/2011 1008   LYMPHSABS 1.9 09/15/2010 1002   MONOABS 0.8 11/02/2011 1008   MONOABS 0.8 09/15/2010 1002   EOSABS 0.1 11/02/2011 1008   EOSABS 0.2 09/15/2010 1002   BASOSABS 0.1 11/02/2011 1008   BASOSABS 0.1 09/15/2010 1002    BMET    Component Value Date/Time   NA 139 11/16/2011 0600   K 3.0* 11/16/2011 0600   CL 104 11/16/2011 0600   CO2 26 11/16/2011 0600   GLUCOSE 122* 11/16/2011 0600   BUN 14 11/16/2011 0600   CREATININE 0.59 11/16/2011 0600   CALCIUM 8.2* 11/16/2011 0600   GFRNONAA >90 11/16/2011 0600   GFRAA >90 11/16/2011 0600    INR    Component Value Date/Time   INR 2.96* 11/17/2011 0445   INR 2.10 09/15/2010 1002     Intake/Output Summary (Last 24 hours) at 11/17/11 0814 Last data filed at 11/16/11 2031  Gross per 24 hour  Intake    750 ml  Output   1150 ml  Net   -400 ml     Assessment/Plan:  66 y.o. male is s/p  Aortobifemoral BPG  POD 7 -continues to improve. -advance diet today. -probably needs one more day to make sure he tolerates diet and pulmonary status is stable. -INR still at 2.96 today--continue to hold coumadin -will check CBC in am. -guiac was negative -INR in am.   Newton Pigg, PA-C [facility] [phone] 11/17/2011 8:14 AM  Addendum  I have independently interviewed and examined the patient, and I agree with the physician assistant's findings.  Improved return of bowel function with tolerance of diet.  Continued flatus with minimal bowel movements.  Incision c/d/i.  I removed the patient's cordis and held pressure.  There was no bleeding after about 10 minutes of pressure.  Pt will need home O2 given desat on ambulation, similar to previous admissions.  Will check CBC tomorrow though no evidence of bleeding clinically.  INR dropping slowly.  I would likely hold on additional Coumadin given he normally takes 2 mg Warfarin with Vitamin K.  He likely will d/c soon with INR in mid-2.0s.  I  Will have his Hematologist manage his anticoagulation given it complicated course.  Leonides Sake, MD [facility] [place] Office: [phone] Pager: [phone]  11/17/2011, 2:53 PM

## 2011-11-17 NOTE — Progress Notes (Addendum)
ANTICOAGULATION CONSULT NOTE - Follow Up Consult  Pharmacy Consult for Coumadin Indication: "clotting disorder", PVD  Allergies  Allergen Reactions  . Ciprofloxacin Hcl Shortness Of Breath and Rash    fever  . Vancomycin Shortness Of Breath and Rash    fever  . Heparin Other (See Comments)    Blood-clots and bleeding.   Assessment: 66 y/o male patient on chronic coumadin for h/o PVD and lupus anticoagulant and history of HITT discovered when patient experienced rapid clot formation hours after vascular surgery in February of 2011.  At that time SRA HITT panel was negative but patient was found to be lupus anticoagulant positive.  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   Pharmacy consulted to resume coumadin with arixtra s/p aortobifemoral bypass on 1/23.  Last warfarin dose prior to admission 1/18, was on 3 mg daily except 2 mg Monday and Wednesday.       Rapid rise in INR after Coumadin 5 mg given 1/24 & 1/25. No Coumadin given 1/26 - 1/29. INR >3 1/27-1/29, now down into target range today.  Last Arixtra dose 1/26.  Goal of Therapy:  INR 2-3   Plan:  1. Coumadin 3 mg today, as INR may continue to decline. 2. Continue daily PT/INR. 3. CBC in am.    Patient Measurements: Height: 5\' 10"   Weight: 180 lb 1.9 oz  IBW/kg (Calculated) : 73 kg  Vital Signs: Temp: 98.3 F (36.8 C) (01/30 0418) Temp src: Oral (01/30 0418) BP: 122/62 mmHg (01/30 0418) Pulse Rate: 76  (01/30 0418)  Labs:  Basename 11/17/11 0445 11/16/11 0600 11/15/11 0615  HGB -- 10.0* 10.3*  HCT -- 29.7* 30.9*  PLT -- 193 216  APTT -- -- --  LABPROT 31.3* 37.7* 39.0*  INR 2.96* 3.76* 3.93*  HEPARINUNFRC -- -- --  CREATININE -- 0.59 0.72  CKTOTAL -- -- --  CKMB -- -- --  TROPONINI -- -- --   Estimated Creatinine Clearance: 96.3 ml/min (by C-G formula based on Cr of 0.59).   Nicolette Bang, RPh Pager:  [phone] 11/17/2011   Addendum:  Discussed briefly with Dr. Imogene Burn.  Coumadin to be held today.  May go home later today.  His hematologist will f/u Coumadin dosing as outpatient. Has been difficult to regulate.

## 2011-11-17 NOTE — Progress Notes (Signed)
Pt ambulated 100 ft with RW and O2 at 2lpm.  Returned to recliner with wife at side and call bell in reach.  Will con't to monitor.

## 2011-11-18 LAB — CBC
HCT: 32.2 % — ABNORMAL LOW (ref 39.0–52.0)
MCHC: 33.5 g/dL (ref 30.0–36.0)
RDW: 13.9 % (ref 11.5–15.5)

## 2011-11-18 LAB — PROTIME-INR
INR: 1.64 — ABNORMAL HIGH (ref 0.00–1.49)
Prothrombin Time: 19.7 seconds — ABNORMAL HIGH (ref 11.6–15.2)

## 2011-11-18 MED ORDER — OXYCODONE HCL 5 MG PO TABS: 5.0000 mg | ORAL_TABLET | ORAL | Status: AC | PRN

## 2011-11-18 MED ORDER — FONDAPARINUX SODIUM 7.5 MG/0.6ML ~~LOC~~ SOLN: 7.5000 mg | SUBCUTANEOUS | Status: DC

## 2011-11-18 MED ORDER — BISACODYL 10 MG RE SUPP
10.0000 mg | Freq: Once | RECTAL | Status: AC
  Administered 2011-11-18: 10 mg via RECTAL
  Filled 2011-11-18: qty 1

## 2011-11-18 MED ORDER — FONDAPARINUX SODIUM 5 MG/0.4ML ~~LOC~~ SOLN
7.5000 mg | SUBCUTANEOUS | Status: DC
  Administered 2011-11-18: 7.5 mg via SUBCUTANEOUS
  Filled 2011-11-18: qty 0.8

## 2011-11-18 MED ORDER — BISACODYL 10 MG RE SUPP: 10.0000 mg | Freq: Every day | RECTAL | Status: AC | PRN

## 2011-11-18 NOTE — Progress Notes (Signed)
Physical Therapy Treatment Patient Details Name: JOVEN MOM MRN: [id number] DOB: 1945/11/15 Today's Date: 11/18/2011  PT Assessment/Plan  PT - Assessment/Plan Comments on Treatment Session: Did well. May d/c home today. PT Plan: Discharge plan remains appropriate;Frequency remains appropriate Follow Up Recommendations: Home health PT PT Goals  Acute Rehab PT Goals PT Goal: Rolling Supine to Right Side - Progress: Met PT Goal: Rolling Supine to Left Side - Progress: Met PT Goal: Supine/Side to Sit - Progress: Met PT Goal: Sit at Edge Of Bed - Progress: Met PT Goal: Sit to Supine/Side - Progress: Met PT Goal: Sit to Stand - Progress: Met PT Goal: Stand to Sit - Progress: Met PT Transfer Goal: Bed to Chair/Chair to Bed - Progress: Met PT Goal: Ambulate - Progress: Progressing toward goal Pt will Go Up / Down Stairs: with modified independence;1-2 stairs PT Goal: Up/Down Stairs - Progress: Updated due to goal met  PT Treatment Precautions/Restrictions  Precautions Precautions:  (dec O2 sats with acitivity) Required Braces or Orthoses: No Restrictions Weight Bearing Restrictions: No Mobility (including Balance) Bed Mobility Bed Mobility: Yes Rolling Left: 6: Modified independent (Device/Increase time) Left Sidelying to Sit: 6: Modified independent (Device/Increase time) Sitting - Scoot to Edge of Bed: 6: Modified independent (Device/Increase time) Transfers Sit to Stand: 6: Modified independent (Device/Increase time) Stand to Sit: 6: Modified independent (Device/Increase time) Stand Pivot Transfers: 6: Modified independent (Device/Increase time) Ambulation/Gait Ambulation/Gait Assistance: 5: Supervision Ambulation/Gait Assistance Details (indicate cue type and reason): amb approx 300 ft while pushing w/c; pt on 3 L O2 [place] throughout amb. and sats only dropped to 89% once remaining at or around 90-91% throughout amb.; pt needed minimal verbal cues for upright posture and safe  speed to reduce energy expenditure and SOB Ambulation Distance (Feet): 300 Feet Assistive device:  (pushing w/c) Gait Pattern: Trunk flexed Stairs: Yes Stairs Assistance: 4: Min assist Stairs Assistance Details (indicate cue type and reason): pt performed 1 step x3 with minA including a HHA on left and min tactile cueing to steady pt on the stair Stair Management Technique: No rails Number of Stairs: 1  (x3)  Posture/Postural Control Posture/Postural Control: No significant limitations Balance Balance Assessed: Yes Dynamic Standing Balance Dynamic Standing - Balance Support: No upper extremity supported Dynamic Standing - Level of Assistance: 4: Min assist Dynamic Standing - Comments: with no UE support pt require minA when initiating any gait or transfers as pt attempted to take 3-4 steps back to recliner and he remained in a flexed trunk position as well as very gaurded stance needing minA to steady himself Exercise    End of Session PT - End of Session Equipment Utilized During Treatment: Gait belt Activity Tolerance: Patient tolerated treatment well;Patient limited by fatigue Patient left: in chair;with call bell in reach General Behavior During Session: [facility] for tasks performed Cognition: [facility] for tasks performed  [facility] HELEN 11/18/2011, 9:28 AM

## 2011-11-18 NOTE — Progress Notes (Signed)
CARE MANAGEMENT NOTE 11/18/2011   Case Manager spoke with patient regarding Home Health. Choice offered,Patient has used [facility] in past, wants to do so now. Entered in [facility].

## 2011-11-18 NOTE — Progress Notes (Signed)
Pt SPO2 on RA 86% after 3 mins of sitting.  Supplemental O2 replaced and SPO2 returned to 92% in <52mins.

## 2011-11-18 NOTE — Progress Notes (Addendum)
[facility] Progress Note  11/18/2011 7:39 AM POD 8  Subjective:  No complaints this am.  +flatus, but no BM.  Eating solids without difficulty.   Filed Vitals:   11/18/11 0512  BP: 122/72  Pulse: 68  Temp: 98.2 F (36.8 C)  Resp: 18   Physical Exam: Cardiac:  RRR Lungs:  CTAB Abdomen:  Soft NT/ND +BS -BM Incisions:  C/d/i without drainage Extremities:  + bilateral DP doppler signals.  BLE warm and well perfused.  CBC    Component Value Date/Time   WBC 12.2* 11/18/2011 0450   WBC 6.4 09/15/2010 1002   RBC 3.51* 11/18/2011 0450   RBC 4.32 09/15/2010 1002   HGB 10.8* 11/18/2011 0450   HGB 13.0 09/15/2010 1002   HCT 32.2* 11/18/2011 0450   HCT 39.1 09/15/2010 1002   PLT 225 11/18/2011 0450   PLT 175 09/15/2010 1002   MCV 91.7 11/18/2011 0450   MCV 90.5 09/15/2010 1002   MCH 30.8 11/18/2011 0450   MCH 30.1 09/15/2010 1002   MCHC 33.5 11/18/2011 0450   MCHC 33.2 09/15/2010 1002   RDW 13.9 11/18/2011 0450   RDW 13.4 09/15/2010 1002   LYMPHSABS 2.0 11/02/2011 1008   LYMPHSABS 1.9 09/15/2010 1002   MONOABS 0.8 11/02/2011 1008   MONOABS 0.8 09/15/2010 1002   EOSABS 0.1 11/02/2011 1008   EOSABS 0.2 09/15/2010 1002   BASOSABS 0.1 11/02/2011 1008   BASOSABS 0.1 09/15/2010 1002        BMET    Component Value Date/Time   NA 139 11/16/2011 0600   K 3.0* 11/16/2011 0600   CL 104 11/16/2011 0600   CO2 26 11/16/2011 0600   GLUCOSE 122* 11/16/2011 0600   BUN 14 11/16/2011 0600   CREATININE 0.59 11/16/2011 0600   CALCIUM 8.2* 11/16/2011 0600   GFRNONAA >90 11/16/2011 0600   GFRAA >90 11/16/2011 0600      INR    Component Value Date/Time   INR 1.64* 11/18/2011 0450   INR 2.10 09/15/2010 1002     Intake/Output Summary (Last 24 hours) at 11/18/11 0739 Last data filed at 11/18/11 0513  Gross per 24 hour  Intake      0 ml  Output   1275 ml  Net  -1275 ml     Assessment/Plan:  66 y.o. male is s/p Aortobifemoral BPG  POD 8 -pt doing well.  +flatus but no  recent BM--will give dulcolax supp this am. -pt INR now sub therapeutic at 1.64 -- ? Home on arixtra and hem-onc to manage coumadin -will d/w Dr. Imogene Burn d/c plans for this pt. -WBC continues to be slightly elevated, but stable -Hgb continues to improve.  Pt tolerating.   Newton Pigg, PA-C [facility] [phone] 11/18/2011 7:39 AM  Addendum  I have independently interviewed and examined the patient, and I agree with the physician assistant's findings.  Pt ok for D/C on Arixtra and Coumadin.  Pt will follow up for titration of his Coumadin.  Home oxygen.  Nurse visit on Friday for staples D/C.  Clinically no obvious infectious focus.  Slight leukocytosis likely secondary to surgery.  Leonides Sake, MD [facility] [place] Office: [phone] Pager: [phone]  11/18/2011, 7:59 AM

## 2011-11-18 NOTE — Discharge Summary (Signed)
[facility] Discharge Summary  NASEER HEARN [birth year] 66 y.o. male  [id number]  Admission Date: 11/10/2011  Discharge Date: 11/18/11  Physician: Nilda Simmer, MD  Admission Diagnosis: CLAUDICATION   HPI:   This is a 66 y.o. male who presents with chief complaint: BLE intermittent claudication. 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 This was complicated with thrombosis shortly after his procedure was completed. He then underwent thormbectomy of fem-fem and a L CFA to BK pop bypass with Propaten. Unfortunately, the patient was found to have a lupus anticoagulant and HITT. These graft eventually clotted. He then underwent a redo R to L fem-fem bypass, which was very difficult. The right common femoral artery was very friable and required patch angioplasty. The patient has been stable in regards to his intermittent claudication, but on recent arterial duplex concerns of stenosis were raised. He underwent angiography which demonstrated proximal common femoral artery/distal external iliac artery severe stenosis with a flow lumen of only 2 mm. Unfortunately, at this point, I feel he will need Aortobifemoral bypass to deal with his left iliac disease and prophylactic deal with his right iliac disease. He has been cleared by his cardiologist. His pulmonologist notes the concerns with his severe COPD: extended ventilation is a possibility.   Hospital Course:  The patient was admitted to the hospital and taken to the operating room on 11/10/2011 and underwent: 1. Bilateral femoral exposure in reoperative field  2. Aortobifemoral bypass graft    The pt tolerated the procedure well and was transported to the PACU in good condition. Post operatively, he was bridged back to Coumadin with arixtra.  His coumadin has been difficult to  manage.  The pt is on Vit K with coumadin d/t his sensitivity to coumadin.  He is being d/c'd on arixtra with coumadin as his INR at d/c is 1.64.  On POD 2, he had an acute on chronic resp failure in the setting of severe COPD and critical care was called.  The pt recovered and did not have any more respiratory events.  He did have some hypokalemia post operatively that was resolved with supplementation.  Onn POD 4, he did have some N/V with gingerale and therefore, his diet was not advanced that day.  His diet was advanced the next day and he has tolerated a diet since then without nausea/vomiting.  He did have acute surgical blood loss anemia and was transfused 2 PRBCs.  His hgb has remained stable since transfusion.  He has had a low grade leukocytosis postoperatively, which is most likely related to surgery as he has not have any fever or any signs of infection. The remainder of the hospital course consisted of increasing ambulation and increasing intake of solids without difficulty.    Basename 11/16/11 0600  NA 139  K 3.0*  CL 104  CO2 26  GLUCOSE 122*  BUN 14  CALCIUM 8.2*    Basename 11/18/11 0450 11/16/11 0600  WBC 12.2* 12.2*  HGB 10.8* 10.0*  HCT 32.2* 29.7*  PLT 225 193    Basename 11/18/11 0450 11/17/11 0445  INR 1.64* 2.96*     Discharge Instructions:   The patient is discharged to home with extensive instructions on wound care and progressive ambulation.  They are instructed not to drive or perform  any heavy lifting until returning to see the physician in his office.  Discharge Orders    Future Appointments: Provider: Department: Dept Phone: Center:   12/03/2011 3:30 PM Kalman Shan, MD [facility] [phone] None     Discharge Orders    Future Appointments: Provider: Department: Dept Phone: Center:   12/03/2011 3:30 PM Kalman Shan, MD [facility] [phone] None     Future Orders Please Complete By Expires   Resume previous diet      Driving  Restrictions      Comments:   No driving for 4 weeks   Lifting restrictions      Comments:   No lifting for 6 weeks   Call MD for:  temperature >100.5      Call MD for:  redness, tenderness, or signs of infection (pain, swelling, bleeding, redness, odor or green/yellow discharge around incision site)      Call MD for:  severe or increased pain, loss or decreased feeling  in affected limb(s)      Increase activity slowly      Comments:   Walk with assistance use walker or cane as needed   may wash over wound with mild soap and water      Scheduling Instructions:   Shower daily with soap and water starting 11/18/11    ABDOMINAL PROCEDURE/ANEURYSM REPAIR/AORTO-BIFEMORAL BYPASS:  Call MD for increased abdominal pain; cramping diarrhea; nausea/vomiting         Discharge Diagnosis:  CLAUDICATION  Secondary Diagnosis: Patient Active Problem List  Diagnoses  . Intermittent claudication  . Preop cardiovascular exam  . Peripheral vascular disease, unspecified   Past Medical History  Diagnosis Date  . Clotting disorder   . Hyperlipidemia   . Degenerative disc disease   . Peripheral arterial disease   . Leg fracture, left   . Leg fracture, right   . PVD (peripheral vascular disease)   . COPD (chronic obstructive pulmonary disease)   . Anxiety   . Depression     Anvith, Mauriello  Home Medication Instructions [id number]   Printed on:11/18/11 3086  Medication Information                    budesonide-formoterol (SYMBICORT) 160-4.5 MCG/ACT inhaler Inhale 2 puffs into the lungs 2 (two) times daily.             sertraline (ZOLOFT) 50 MG tablet Take 50 mg by mouth daily.             tiotropium (SPIRIVA) 18 MCG inhalation capsule Place 18 mcg into inhaler and inhale daily.             traZODone (DESYREL) 50 MG tablet Take 25 mg by mouth at bedtime. 1/2 tablet at night           warfarin (COUMADIN) 3 MG tablet Take 3 mg by mouth See admin instructions. Sunday, Tuesday,  Thursday, Friday and saturday           atorvastatin (LIPITOR) 40 MG tablet Take 40 mg by mouth daily.             CIALIS 20 MG tablet Take 20 mg by mouth daily as needed. For erectile dysfunction.           warfarin (COUMADIN) 1 MG tablet Take 2 mg by mouth 2 (two) times a week. Monday and Wednesday.           ferrous fumarate (HEMOCYTE - 106 MG FE) 325 (106 FE) MG  TABS Take 1 tablet by mouth daily.             albuterol (PROVENTIL HFA;VENTOLIN HFA) 108 (90 BASE) MCG/ACT inhaler Inhale 2 puffs into the lungs every 6 (six) hours as needed. For shortness of breath.            Calcium-Vitamin D-Vitamin K [id number] MG-UNT-MCG CHEW Chew 2 tablets by mouth daily.             bisacodyl (DULCOLAX) 10 MG suppository Place 1 suppository (10 mg total) rectally daily as needed.           oxyCODONE (ROXICODONE) 5 MG immediate release tablet Take 1 tablet (5 mg total) by mouth every 4 (four) hours as needed for pain.           fondaparinux (ARIXTRA) 7.5 MG/0.6ML SOLN Inject 0.6 mLs (7.5 mg total) into the skin daily.            Will discharge pt on Arixtra and Coumadin.  We will have home health RN check INR on Friday and call results to Armanda Heritage at [facility] in [place].  Disposition: home  Patient's condition: is Good  Follow up: 1. Dr. Imogene Burn 2 weeks. 2. F/u with Armanda Heritage ([facility]) on Monday. 3.  Will have home health nurse check INR on 11/19/11 and have results called to [facility].   Newton Pigg, PA-C [facility] [phone]  11/18/2011  7:45 AM   Addendum  I have independently interviewed and examined the patient, and I agree with the physician assistant's Discharge Summary.  Pt's incision are healing well without signs of infection.  His pulmonary status has improved, but will need home oxygen while recovering given his severe COPD.  His thrombophilia will be managed with Arixtra bridge to Coumadin, managed by his Hematologist.   We'll have his staples removed tomorrow.  Follow up in the office in 2 weeks.  Leonides Sake, MD [facility] [place] Office: [phone] Pager: [phone]  11/18/2011, 10:20 AM

## 2011-12-02 ENCOUNTER — Encounter: Payer: Self-pay | Admitting: Vascular Surgery

## 2011-12-03 ENCOUNTER — Ambulatory Visit (INDEPENDENT_AMBULATORY_CARE_PROVIDER_SITE_OTHER): Payer: BC Managed Care – PPO | Admitting: Vascular Surgery

## 2011-12-03 ENCOUNTER — Encounter: Payer: Self-pay | Admitting: Vascular Surgery

## 2011-12-03 ENCOUNTER — Inpatient Hospital Stay: Payer: BC Managed Care – PPO | Admitting: Internal Medicine

## 2011-12-03 VITALS — BP 126/101 | HR 77 | Resp 16 | Ht 70.0 in | Wt 172.0 lb

## 2011-12-03 DIAGNOSIS — Z95828 Presence of other vascular implants and grafts: Secondary | ICD-10-CM | POA: Insufficient documentation

## 2011-12-03 DIAGNOSIS — Z9889 Other specified postprocedural states: Secondary | ICD-10-CM

## 2011-12-03 DIAGNOSIS — I739 Peripheral vascular disease, unspecified: Secondary | ICD-10-CM

## 2011-12-03 NOTE — Progress Notes (Signed)
[facility] [place]  Post-operative Aortobifemoral bypass for Intermittent Claudication  History of Present Illness  [name] is a 66 y.o. male who presents post-op s/p aortobifemoral bypass (Date: 11/10/11).  The patient has had further pain.  He felt a bit fatigued still.  He no longer needs home oxygen.  Physical Examination  Filed Vitals:   12/03/11 1444  BP: 126/101  Pulse: 77  Resp: 16  Height: 5\' 10"  (1.778 m)  Weight: 172 lb (78.019 kg)  SpO2: 99%    Gastrointestinal: soft, NTND, -G/R, - HSM, - masses, - CVAT B, incision healing, few steristrips in place  Medical Decision Making  [name] is a 66 y.o. male who presents s/p aortobifemoral bypass  The patient will follow up with [place] in 2 month with BLE ABI and aortoiliac duplex  Thank you for allowing [place] to participate in this patient's care.  Leonides Sake, MD [facility] [place] Office: [phone] Pager: [phone]

## 2012-01-28 ENCOUNTER — Ambulatory Visit: Payer: BC Managed Care – PPO | Admitting: Vascular Surgery

## 2012-03-02 ENCOUNTER — Encounter: Payer: Self-pay | Admitting: Vascular Surgery

## 2012-03-03 ENCOUNTER — Ambulatory Visit (INDEPENDENT_AMBULATORY_CARE_PROVIDER_SITE_OTHER): Payer: BC Managed Care – PPO | Admitting: Vascular Surgery

## 2012-03-03 ENCOUNTER — Other Ambulatory Visit (INDEPENDENT_AMBULATORY_CARE_PROVIDER_SITE_OTHER): Payer: BC Managed Care – PPO | Admitting: *Deleted

## 2012-03-03 ENCOUNTER — Encounter (INDEPENDENT_AMBULATORY_CARE_PROVIDER_SITE_OTHER): Payer: BC Managed Care – PPO | Admitting: *Deleted

## 2012-03-03 ENCOUNTER — Encounter: Payer: Self-pay | Admitting: Vascular Surgery

## 2012-03-03 VITALS — BP 134/82 | HR 74 | Temp 97.1°F | Ht 70.0 in | Wt 173.0 lb

## 2012-03-03 DIAGNOSIS — I70219 Atherosclerosis of native arteries of extremities with intermittent claudication, unspecified extremity: Secondary | ICD-10-CM

## 2012-03-03 DIAGNOSIS — I739 Peripheral vascular disease, unspecified: Secondary | ICD-10-CM

## 2012-03-03 DIAGNOSIS — Z95828 Presence of other vascular implants and grafts: Secondary | ICD-10-CM

## 2012-03-03 DIAGNOSIS — Z48812 Encounter for surgical aftercare following surgery on the circulatory system: Secondary | ICD-10-CM

## 2012-03-03 DIAGNOSIS — Z9889 Other specified postprocedural states: Secondary | ICD-10-CM | POA: Insufficient documentation

## 2012-03-03 NOTE — Progress Notes (Signed)
[facility] [place]  Established Previous Bypass  History of Present Illness  [name] is a 66 y.o. (08/29/1946) male who presents with chief complaint: routine follow up.  Previous operation(s) include:  PROCEDURE:  1. Bilateral femoral exposure in reoperative field  2. Aortobifemoral bypass graft   (Date: 11/14/11).  The patient's symptoms have improved.  The patient's symptoms are: intermittent claudication. The patient's treatment regimen currently included: maximal medical management.  The patient continues to have come pulmonary issues with his COPD.  Past Medical History, Past Surgical History, Social History, Family History, Medications, Allergies, and Review of Systems are unchanged from previous evaluation on 12/03/11.  Physical Examination  Filed Vitals:   03/03/12 1029  BP: 134/82  Pulse: 74  Temp: 97.1 F (36.2 C)  TempSrc: Oral  Height: 5\' 10"  (1.778 m)  Weight: 173 lb (78.472 kg)   Body mass index is 24.82 kg/(m^2).  General: A&O x 3, WDWN  Pulmonary: Sym exp, good air movt, CTAB, no rales, rhonchi, & wheezing  Cardiac: RRR, Nl S1, S2, no Murmurs, rubs or gallops  Vascular: Vessel Right Left  Radial Palpable Palpable  Brachial Palpable Palpable  Carotid Palpable, without bruit Palpable, without bruit  Aorta Non-palpable N/A  Femoral Palpable Palpable  Popliteal Non-palpable Non-palpable  PT Non-Palpable Non-Palpable  DP Non-Palpable Non-Palpable   Gastrointestinal:  NABS, NTND, incison well healed  Musculoskeletal: M/S 5/5 throughout , Extremities without ischemic changes , B groin incisions well healed  Neurologic: Pain and light touch intact in extremities , Motor exam as listed above  Non-Invasive Vascular Imaging ABI (Date: 03/03/12)  RLE: 0.64, PT and DP: monophasic  LLE: 0.62, PT and DP: monophasic  Aortoiliac Duplex (Date: 03/03/12)  Widely patent aortobifemoral BPG  Medical Decision Making  [name]  is a 66 y.o. male who presents with: s/p Aortobifemoral BPG for intermittent claudication  Based on the patient's vascular studies and examination, I have offered the patient: continued surveillance q6 month.  Given the patient's thrombophilia and redo-redo-redo groin status, I do not intend on proceeding with any further leg revascularization at this point, so maximal medical management of the pt's PAD will take front stage.  I discussed in depth with the patient the nature of atherosclerosis, and emphasized the importance of maximal medical management including strict control of blood pressure, blood glucose, and lipid levels, obtaining regular exercise, and cessation of smoking.  The patient is aware that without maximal medical management the underlying atherosclerotic disease process will progress, limiting the benefit of any interventions.  I discussed in depth with the patient the need for long term surveillance to improve the primary assisted patency of his bypass.  The patient agrees to cooperate with such.  The patient is scheduled for the previous listed surveillance studies in 6 months.  Thank you for allowing [place] to participate in this patient's care.  Leonides Sake, MD [facility] [place] Office: [phone] Pager: [phone]  03/03/2012, 8:46 PM

## 2012-03-10 NOTE — Procedures (Unsigned)
BYPASS GRAFT EVALUATION  INDICATION:  Followup aortobifemoral bypass graft  HISTORY: Diabetes:  No Cardiac:  COPD Hypertension:  No Smoking:  Previous Previous Surgery:  Status post aortobifemoral bypass graft 11/10/2011  SINGLE LEVEL ARTERIAL EXAM                              RIGHT              LEFT Brachial: Anterior tibial: Posterior tibial: Peroneal: Ankle/brachial index:        0.64               0.62  PREVIOUS ABI:  Date:  11/13/2010  RIGHT:  0.68  LEFT:  0.54  LOWER EXTREMITY BYPASS GRAFT DUPLEX EXAM:  DUPLEX:  Duplex imaging of the aortobifemoral bypass graft reveals a patent graft with biphasic flow throughout.  Distal portion of lower extremities not imaged.  IMPRESSION:  Patent aortobifemoral bypass graft.  See attached for ankle brachial indices.  ___________________________________________ [name], MD  SS/MEDQ  D:  03/03/2012  T:  03/03/2012  Job:  [id number]

## 2012-09-08 ENCOUNTER — Ambulatory Visit: Payer: BC Managed Care – PPO | Admitting: Vascular Surgery

## 2012-09-28 ENCOUNTER — Encounter: Payer: Self-pay | Admitting: Neurosurgery

## 2012-09-29 ENCOUNTER — Encounter (INDEPENDENT_AMBULATORY_CARE_PROVIDER_SITE_OTHER): Payer: Medicare Other | Admitting: *Deleted

## 2012-09-29 ENCOUNTER — Ambulatory Visit (INDEPENDENT_AMBULATORY_CARE_PROVIDER_SITE_OTHER): Payer: Medicare Other | Admitting: Neurosurgery

## 2012-09-29 ENCOUNTER — Other Ambulatory Visit: Payer: Medicare Other

## 2012-09-29 ENCOUNTER — Encounter: Payer: Self-pay | Admitting: Neurosurgery

## 2012-09-29 VITALS — BP 117/72 | HR 82 | Resp 16 | Ht 70.0 in | Wt 177.7 lb

## 2012-09-29 DIAGNOSIS — I739 Peripheral vascular disease, unspecified: Secondary | ICD-10-CM

## 2012-09-29 DIAGNOSIS — R202 Paresthesia of skin: Secondary | ICD-10-CM

## 2012-09-29 DIAGNOSIS — Z48812 Encounter for surgical aftercare following surgery on the circulatory system: Secondary | ICD-10-CM

## 2012-09-29 DIAGNOSIS — R209 Unspecified disturbances of skin sensation: Secondary | ICD-10-CM

## 2012-09-29 DIAGNOSIS — I70219 Atherosclerosis of native arteries of extremities with intermittent claudication, unspecified extremity: Secondary | ICD-10-CM

## 2012-09-29 NOTE — Progress Notes (Signed)
[facility] [place] PAD/PVD Office Note  CC: PVD surveillance Referring Physician: Imogene Burn  History of Present Illness: 66 year old male patient of Dr. Imogene Burn status post aortobifem bypass graft in January 2013. Patient has remote history of a right to left fem-fem bypass graft with a redo in the right common femoral artery proximal superficial femoral artery endarterectomy in January 2012, also, left femoral popliteal bypass graft in November 2011 which is occluded. The patient denies claudication or rest pain. The patient's able to carry out his ADLs without difficulty. The patient and his wife tell me he has had a 2 or 3 level ACDF with Dr. Venetia Maxon and are requesting referral back to that practice for evaluation of some paresthesias he has in his legs that he thinks are coming from his neck  Past Medical History  Diagnosis Date  . Clotting disorder   . Hyperlipidemia   . Degenerative disc disease   . Peripheral arterial disease   . Leg fracture, left   . Leg fracture, right   . PVD (peripheral vascular disease)   . COPD (chronic obstructive pulmonary disease)   . Anxiety   . Depression     ROS: [x]  Positive   [ ]  Denies    General: [ ]  Weight loss, [ ]  Fever, [ ]  chills Neurologic: [ ]  Dizziness, [ ]  Blackouts, [ ]  Seizure [ ]  Stroke, [ ]  "Mini stroke", [ ]  Slurred speech, [ ]  Temporary blindness; [ ]  weakness in arms or legs, [ ]  Hoarseness Cardiac: [ ]  Chest pain/pressure, [ ]  Shortness of breath at rest [ ]  Shortness of breath with exertion, [ ]  Atrial fibrillation or irregular heartbeat Vascular: [ ]  Pain in legs with walking, [ ]  Pain in legs at rest, [ ]  Pain in legs at night,  [ ]  Non-healing ulcer, [ ]  Blood clot in vein/DVT,   Pulmonary: [ ]  Home oxygen, [ ]  Productive cough, [ ]  Coughing up blood, [ ]  Asthma,  [ ]  Wheezing Musculoskeletal:  [ ]  Arthritis, [ ]  Low back pain, [ ]  Joint pain Hematologic: [ ]  Easy Bruising, [ ]  Anemia; [ ]   Hepatitis Gastrointestinal: [ ]  Blood in stool, [ ]  Gastroesophageal Reflux/heartburn, [ ]  Trouble swallowing Urinary: [ ]  chronic Kidney disease, [ ]  on HD - [ ]  MWF or [ ]  TTHS, [ ]  Burning with urination, [ ]  Difficulty urinating Skin: [ ]  Rashes, [ ]  Wounds Psychological: [ ]  Anxiety, [ ]  Depression   Social History History  Substance Use Topics  . Smoking status: Former Smoker -- 1.5 packs/day    Quit date: 08/13/2010  . Smokeless tobacco: Former Neurosurgeon  . Alcohol Use: No    Family History Family History  Problem Relation Age of Onset  . Cancer Mother   . Emphysema Mother   . Lung cancer Mother   . Heart disease Father     Heart Disease before age 79  . Cancer Sister   . Cancer Brother   . Diabetes Brother   . Hyperlipidemia Brother   . Hypertension Brother   . Heart disease Brother     Heart Disease before age 23    Allergies  Allergen Reactions  . Ciprofloxacin Hcl Shortness Of Breath and Rash    fever  . Vancomycin Shortness Of Breath and Rash    fever  . Heparin Other (See Comments)    Blood-clots and bleeding.    Current Outpatient Prescriptions  Medication Sig Dispense Refill  . albuterol (PROVENTIL  HFA;VENTOLIN HFA) 108 (90 BASE) MCG/ACT inhaler Inhale 2 puffs into the lungs every 6 (six) hours as needed. For shortness of breath.       Marland Kitchen atorvastatin (LIPITOR) 40 MG tablet Take 40 mg by mouth daily.        . budesonide-formoterol (SYMBICORT) 160-4.5 MCG/ACT inhaler Inhale 2 puffs into the lungs 2 (two) times daily.        . Calcium-Vitamin D-Vitamin K [id number] MG-UNT-MCG CHEW Chew 2 tablets by mouth daily.        Marland Kitchen CIALIS 20 MG tablet Take 20 mg by mouth daily as needed. For erectile dysfunction.      . ferrous fumarate (HEMOCYTE - 106 MG FE) 325 (106 FE) MG TABS Take 1 tablet by mouth daily.        . sertraline (ZOLOFT) 50 MG tablet Take 50 mg by mouth daily.        Marland Kitchen tiotropium (SPIRIVA) 18 MCG inhalation capsule Place 18 mcg into inhaler and inhale  daily.        . traZODone (DESYREL) 50 MG tablet Take 25 mg by mouth at bedtime. 1/2 tablet at night      . warfarin (COUMADIN) 1 MG tablet Take 2 mg by mouth 2 (two) times a week. Monday and Wednesday.      . warfarin (COUMADIN) 3 MG tablet Take 3 mg by mouth See admin instructions. Sunday, Tuesday, Thursday, Friday and saturday      . fondaparinux (ARIXTRA) 7.5 MG/0.6ML SOLN Inject 0.6 mLs (7.5 mg total) into the skin daily.  3 mL  3  . mupirocin ointment (BACTROBAN) 2 %       . oxyCODONE (OXY IR/ROXICODONE) 5 MG immediate release tablet         Physical Examination  Filed Vitals:   09/29/12 1049  BP: 117/72  Pulse: 82  Resp: 16    Body mass index is 25.50 kg/(m^2).  General:  WDWN in NAD Gait: Normal HEENT: WNL Eyes: Pupils equal Pulmonary: normal non-labored breathing , without Rales, rhonchi,  wheezing Cardiac: RRR, without  Murmurs, rubs or gallops; No carotid bruits Abdomen: soft, NT, no masses Skin: no rashes, ulcers noted Vascular Exam/Pulses: Audible femoral pulses bilaterally, I do not palpate lower extremity pulses, 3+ radial pulses, no carotid bruits are heard  Extremities without ischemic changes, no Gangrene , no cellulitis; no open wounds;  Musculoskeletal: no muscle wasting or atrophy  Neurologic: A&O X 3; Appropriate Affect ; SENSATION: normal; MOTOR FUNCTION:  moving all extremities equally. Speech is fluent/normal  Non-Invasive Vascular Imaging: ABIs today are improved from previous exam. Today he is 0.91 on the right, 0.73 on the left and monophasic, previously in May of 2013 he was 0.64 on the right, 0.62 on the left  ASSESSMENT/PLAN: Asymptomatic patient that I will refer to Dr. Delma Officer for further evaluation of the cervical spine. The patient will followup here in 6 months with repeat studies. The patient's questions were encouraged and answered, he is in agreement with this plan.  Lauree Chandler ANP  Chronic M.D.: Imogene Burn

## 2012-09-29 NOTE — Addendum Note (Signed)
Addended by: Sharee Pimple on: 09/29/2012 11:22 AM   Modules accepted: Orders

## 2012-09-29 NOTE — Addendum Note (Signed)
Addended by: Sharee Pimple on: 09/29/2012 03:41 PM   Modules accepted: Orders

## 2012-10-03 ENCOUNTER — Emergency Department (HOSPITAL_COMMUNITY): Payer: Medicare Other

## 2012-10-03 ENCOUNTER — Encounter (HOSPITAL_COMMUNITY): Payer: Self-pay | Admitting: Physical Medicine and Rehabilitation

## 2012-10-03 ENCOUNTER — Emergency Department (HOSPITAL_COMMUNITY)
Admission: EM | Admit: 2012-10-03 | Discharge: 2012-10-03 | Disposition: A | Discharge status: Home / Self Care | Payer: Medicare Other | Attending: Emergency Medicine | Admitting: Emergency Medicine

## 2012-10-03 DIAGNOSIS — F329 Major depressive disorder, single episode, unspecified: Secondary | ICD-10-CM | POA: Insufficient documentation

## 2012-10-03 DIAGNOSIS — F3289 Other specified depressive episodes: Secondary | ICD-10-CM | POA: Insufficient documentation

## 2012-10-03 DIAGNOSIS — R413 Other amnesia: Secondary | ICD-10-CM

## 2012-10-03 DIAGNOSIS — Z881 Allergy status to other antibiotic agents status: Secondary | ICD-10-CM | POA: Insufficient documentation

## 2012-10-03 DIAGNOSIS — Z9889 Other specified postprocedural states: Secondary | ICD-10-CM | POA: Insufficient documentation

## 2012-10-03 DIAGNOSIS — Z8739 Personal history of other diseases of the musculoskeletal system and connective tissue: Secondary | ICD-10-CM | POA: Insufficient documentation

## 2012-10-03 DIAGNOSIS — I739 Peripheral vascular disease, unspecified: Secondary | ICD-10-CM | POA: Insufficient documentation

## 2012-10-03 DIAGNOSIS — F411 Generalized anxiety disorder: Secondary | ICD-10-CM | POA: Insufficient documentation

## 2012-10-03 DIAGNOSIS — IMO0002 Reserved for concepts with insufficient information to code with codable children: Secondary | ICD-10-CM | POA: Insufficient documentation

## 2012-10-03 DIAGNOSIS — Z8781 Personal history of (healed) traumatic fracture: Secondary | ICD-10-CM | POA: Insufficient documentation

## 2012-10-03 DIAGNOSIS — D689 Coagulation defect, unspecified: Secondary | ICD-10-CM | POA: Insufficient documentation

## 2012-10-03 DIAGNOSIS — Z7901 Long term (current) use of anticoagulants: Secondary | ICD-10-CM | POA: Insufficient documentation

## 2012-10-03 DIAGNOSIS — Z888 Allergy status to other drugs, medicaments and biological substances status: Secondary | ICD-10-CM | POA: Insufficient documentation

## 2012-10-03 DIAGNOSIS — Z79899 Other long term (current) drug therapy: Secondary | ICD-10-CM | POA: Insufficient documentation

## 2012-10-03 DIAGNOSIS — J4489 Other specified chronic obstructive pulmonary disease: Secondary | ICD-10-CM | POA: Insufficient documentation

## 2012-10-03 DIAGNOSIS — J449 Chronic obstructive pulmonary disease, unspecified: Secondary | ICD-10-CM | POA: Insufficient documentation

## 2012-10-03 DIAGNOSIS — Z87891 Personal history of nicotine dependence: Secondary | ICD-10-CM | POA: Insufficient documentation

## 2012-10-03 DIAGNOSIS — Z86718 Personal history of other venous thrombosis and embolism: Secondary | ICD-10-CM | POA: Insufficient documentation

## 2012-10-03 DIAGNOSIS — E785 Hyperlipidemia, unspecified: Secondary | ICD-10-CM | POA: Insufficient documentation

## 2012-10-03 LAB — CBC WITH DIFFERENTIAL/PLATELET
Basophils Absolute: 0 10*3/uL (ref 0.0–0.1)
Basophils Relative: 0 % (ref 0–1)
Eosinophils Absolute: 0.1 10*3/uL (ref 0.0–0.7)
Eosinophils Relative: 1 % (ref 0–5)
HCT: 45.4 % (ref 39.0–52.0)
Hemoglobin: 16 g/dL (ref 13.0–17.0)
MCH: 31.9 pg (ref 26.0–34.0)
MCHC: 35.2 g/dL (ref 30.0–36.0)
MCV: 90.6 fL (ref 78.0–100.0)
Monocytes Absolute: 0.7 10*3/uL (ref 0.1–1.0)
Monocytes Relative: 7 % (ref 3–12)
RDW: 13.2 % (ref 11.5–15.5)

## 2012-10-03 LAB — BASIC METABOLIC PANEL
BUN: 11 mg/dL (ref 6–23)
Calcium: 10 mg/dL (ref 8.4–10.5)
Chloride: 104 mEq/L (ref 96–112)
Creatinine, Ser: 0.84 mg/dL (ref 0.50–1.35)
GFR calc Af Amer: >90 mL/min (ref >90)

## 2012-10-03 LAB — PROTIME-INR: INR: 1.85 — ABNORMAL HIGH (ref 0.00–1.49)

## 2012-10-03 NOTE — ED Notes (Signed)
EKg shown to Dr. Silverio Lay, copies in the chart

## 2012-10-03 NOTE — ED Provider Notes (Signed)
History     CSN: [id number]  Arrival date & time 10/03/12  1257   First MD Initiated Contact with Patient 10/03/12 1901      Chief Complaint  Patient presents with  . Altered Mental Status    (Consider location/radiation/quality/duration/timing/severity/associated sxs/prior treatment) HPI Comments: Patient states he forgot how to tie a tie this morning. This lasted for about an hour. The wife noted no facial asymmetry, aphasia, weakness, ataxia at this time. It resolved on its own. Patient called his doctor who instructed him to come to the hospital due to his multiple comorbid medical conditions. no previous stroke.   Patient is a 66 y.o. male presenting with altered mental status and neurologic complaint. The history is provided by the patient.  Altered Mental Status This is a new problem. The current episode started today. Episode frequency: Once. The problem has been resolved. Pertinent negatives include no abdominal pain, chills, coughing or fever. Nothing aggravates the symptoms. He has tried nothing for the symptoms.  Neurologic Problem The primary symptoms include altered mental status. Primary symptoms do not include fever.    Past Medical History  Diagnosis Date  . Clotting disorder   . Hyperlipidemia   . Degenerative disc disease   . Peripheral arterial disease   . Leg fracture, left   . Leg fracture, right   . PVD (peripheral vascular disease)   . COPD (chronic obstructive pulmonary disease)   . Anxiety   . Depression     Past Surgical History  Procedure Date  . Redo r to l femoral-femoral bypass 10/27/10  . Iliofemoral endarterectomy with bovine patch angioplasty 10/27/10  . Thombectomy and fem pop bypass 08/17/10   . Aotogram 07/30/10  . Neck surgery   . Cardiovascular stress test 08/03/2010    EF 53%. NO ISCHEMIA  . Fracture surgery     pins left femur  . Aorta - bilateral femoral artery bypass graft 11/10/2011    Procedure: AORTA BIFEMORAL BYPASS GRAFT;   Surgeon: Nilda Simmer, MD;  Location: [facility] OR;  Service: Vascular;  Laterality: N/A;  . Pr vein bypass graft,aorto-fem-pop     Family History  Problem Relation Age of Onset  . Cancer Mother   . Emphysema Mother   . Lung cancer Mother   . Heart disease Father     Heart Disease before age 80  . Cancer Sister   . Cancer Brother   . Diabetes Brother   . Hyperlipidemia Brother   . Hypertension Brother   . Heart disease Brother     Heart Disease before age 32    History  Substance Use Topics  . Smoking status: Former Smoker -- 1.5 packs/day    Quit date: 08/13/2010  . Smokeless tobacco: Former Neurosurgeon  . Alcohol Use: No      Review of Systems  Constitutional: Negative for fever and chills.  Respiratory: Negative for cough and shortness of breath.   Gastrointestinal: Negative for abdominal pain.  Psychiatric/Behavioral: Positive for altered mental status.  All other systems reviewed and are negative.    Allergies  Ciprofloxacin hcl; Vancomycin; and Heparin  Home Medications   Current Outpatient Rx  Name  Route  Sig  Dispense  Refill  . ALBUTEROL SULFATE HFA 108 (90 BASE) MCG/ACT IN AERS   Inhalation   Inhale 2 puffs into the lungs every 6 (six) hours as needed. For shortness of breath.          . ATORVASTATIN CALCIUM 40 MG PO TABS  Oral   Take 40 mg by mouth at bedtime.          . BUDESONIDE-FORMOTEROL FUMARATE 160-4.5 MCG/ACT IN AERO   Inhalation   Inhale 2 puffs into the lungs 2 (two) times daily.           Marland Kitchen CALCIUM-VITAMIN D-VITAMIN K [id number] MG-UNT-MCG PO CHEW   Oral   Chew 2 tablets by mouth at bedtime.          Marland Kitchen CIALIS 20 MG PO TABS   Oral   Take 20 mg by mouth daily as needed. For erectile dysfunction.         Marland Kitchen FERROUS FUMARATE 325 (106 FE) MG PO TABS   Oral   Take 1 tablet by mouth at bedtime.          . SERTRALINE HCL 50 MG PO TABS   Oral   Take 50 mg by mouth at bedtime.          Marland Kitchen TIOTROPIUM BROMIDE MONOHYDRATE 18  MCG IN CAPS   Inhalation   Place 18 mcg into inhaler and inhale daily.           . TRAZODONE HCL 50 MG PO TABS   Oral   Take 25 mg by mouth at bedtime.          . WARFARIN SODIUM 1 MG PO TABS   Oral   Take 2 mg by mouth See admin instructions. Takes on Mon, Tues, Wed, Fri and Sun         . WARFARIN SODIUM 3 MG PO TABS   Oral   Take 3 mg by mouth See admin instructions. Takes 3mg  on Thurs & Sat and 2mg  on all other days           BP 155/76  Pulse 105  Temp 97.9 F (36.6 C) (Oral)  Resp 18  SpO2 93%  Physical Exam  Constitutional: He is oriented to person, place, and time. He appears well-developed and well-nourished. No distress.  HENT:  Head: Normocephalic and atraumatic.  Mouth/Throat: No oropharyngeal exudate.  Eyes: EOM are normal. Pupils are equal, round, and reactive to light.  Neck: Normal range of motion. Neck supple.  Cardiovascular: Normal rate and regular rhythm.  Exam reveals no friction rub.   No murmur heard. Pulmonary/Chest: Effort normal and breath sounds normal. No respiratory distress. He has no wheezes. He has no rales.  Abdominal: He exhibits no distension. There is no tenderness. There is no rebound.  Musculoskeletal: Normal range of motion. He exhibits no edema.  Neurological: He is alert and oriented to person, place, and time. No cranial nerve deficit. He exhibits normal muscle tone. Coordination normal.       Normal gait  Skin: He is not diaphoretic.    ED Course  Procedures (including critical care time)  Labs Reviewed  BASIC METABOLIC PANEL - Abnormal; Notable for the following:    Glucose, Bld 105 (*)     GFR calc non Af Amer 90 (*)     All other components within normal limits  CBC WITH DIFFERENTIAL  URINALYSIS, ROUTINE W REFLEX MICROSCOPIC   Ct Head Wo Contrast  10/03/2012  *RADIOLOGY REPORT*  Clinical Data: Altered mental status.  CT HEAD WITHOUT CONTRAST  Technique:  Contiguous axial images were obtained from the base of  the skull through the vertex without contrast.  Comparison: None.  Findings: The brain stem, cerebellum, cerebral peduncles, thalami, basal ganglia, basilar cisterns, and ventricular system appear unremarkable.  No intracranial hemorrhage, mass lesion, or acute infarction is identified.  Mild atherosclerotic calcification of the carotid siphons noted.  IMPRESSION:  1.  Atherosclerosis.  No acute intracranial findings are identified.   Original Report Authenticated By: Gaylyn Rong, M.D.      1. Memory problem       MDM   66 year-old with extensive peripheral vascular disease, DVTs presents with never lost. Had one hour he can arm or appetite high. He did not have any other memory loss that he notes. Wife noted no neurologic symptoms at time of onset. No history of stroke. Patient still asymptomatic at this time. Vitals are stable here. He does drop his oxygen saturation on exertion, however this is chronic due to his COPD. He wears oxygen nightly. On exam he has no neurologic abnormalities. I am not overly concerned for a stroke in this gentleman. His memory loss history is very vague and does not seem like a typical stroke. I will scan his head and check basic labs. Scan negative. Labs normal. With such vague symptoms, I feel patient is stable for outpatient f/u. Patient comfortable with this plan.       Elwin Mocha, MD 10/04/12 [phone]

## 2012-10-03 NOTE — ED Notes (Signed)
Pt presents to department for evaluation of altered mental status. States he could not remember how to tie necktie this morning @8 :00am. States this lasted x1 hour. Upon arrival pt is conscious alert and oriented x4. Strong equal grip strengths. Facial symmetry. No slurred speech. Denies pain.

## 2012-10-05 ENCOUNTER — Telehealth: Payer: Self-pay | Admitting: Neurosurgery

## 2012-10-05 NOTE — Telephone Encounter (Signed)
I spoke with the patient at [facility] request regarding the referral to [facility]. They are not able to see him because of him being terminated from their practice in 2005. The patient is aware of this and stated that he was seen on 10/03/12 at [facility] and they are referring him to [facility] tomorrow 10/06/12 for neurology evaluation. He will contact me once that is completed to see if we need to refer him to [facility] for a neurosurgeon eval. awt

## 2012-10-05 NOTE — ED Provider Notes (Signed)
I have supervised the resident on the management of this patient and agree with the note above. I personally interviewed and examined the patient and my addendum is below.   [name] is a 66 y.o. male hx of PVD, DVTS here with memory lost. Earlier in the day, he couldn't remember how to tie his neck tie. He was able to remember afterwards. He denies slurred speech or weakness at the time. NL neuro exam. CT head nl. Labs unremarkable. I feel that he is likely to have early dementia. Clinical suspicion for stroke is low. He is stable for outpatient f/u.   Richardean Canal, MD 10/05/12 [phone]

## 2013-01-23 ENCOUNTER — Encounter: Payer: Self-pay | Admitting: Cardiology

## 2013-01-31 IMAGING — CT CT HEAD W/O CM
1 series · 16 of 30 positions shown, 20 images · non-contrast
Comparison: None.

CLINICAL DATA: Altered mental status.

CT HEAD WITHOUT CONTRAST
TECHNIQUE: Contiguous axial images were obtained from the base of
the skull through the vertex without contrast.

[Series 2: head routine 4.8 h37s · axial · 0.48mm/px · z∈[-131,+24]mm · 16 of 36 slices shown, 20 images]
[im 2/36  brain]
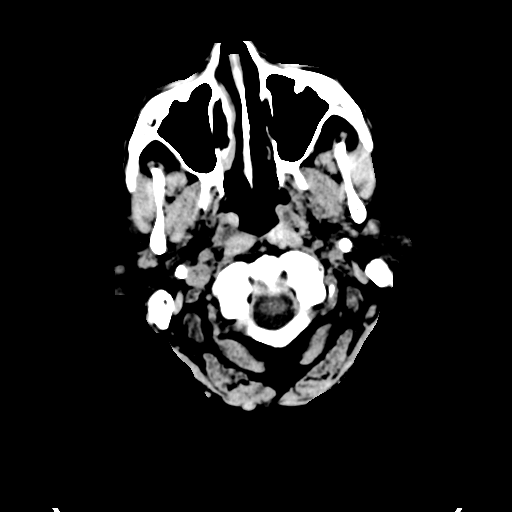
[im 2/36  bone]
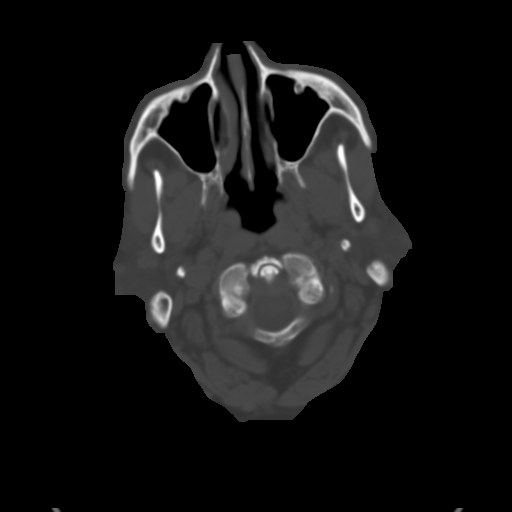
[im 4/36  brain]
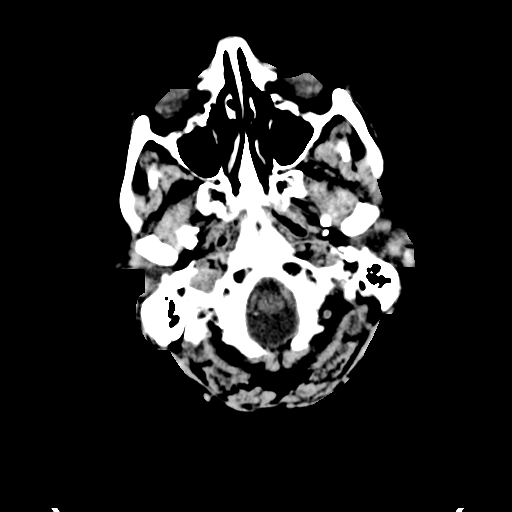
[im 7/36  brain]
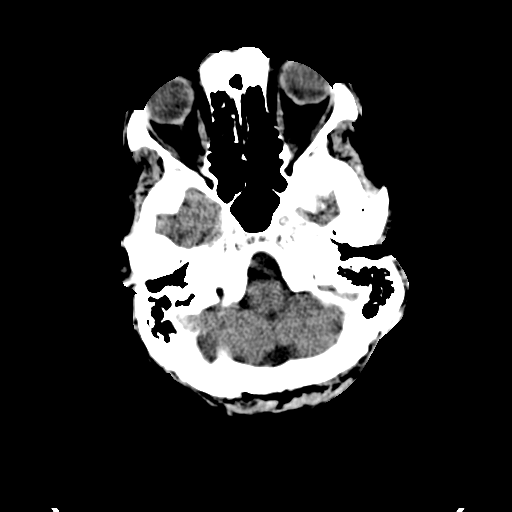
[im 9/36  brain]
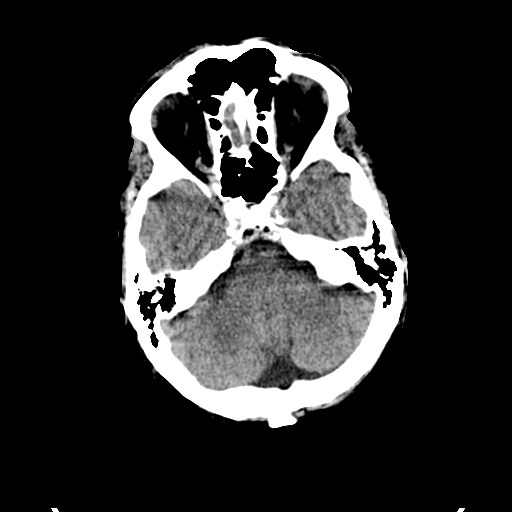
[im 10/36  brain]
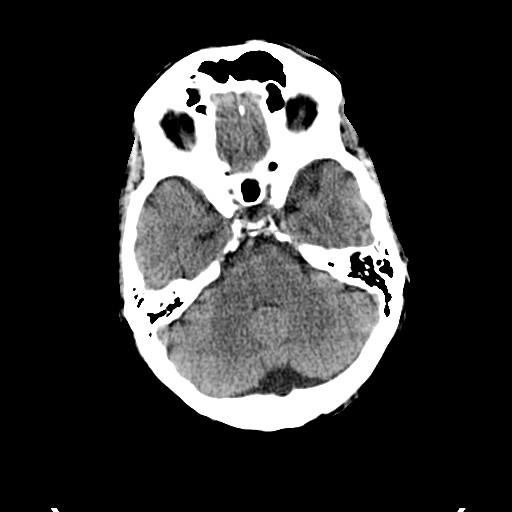
[im 10/36  bone]
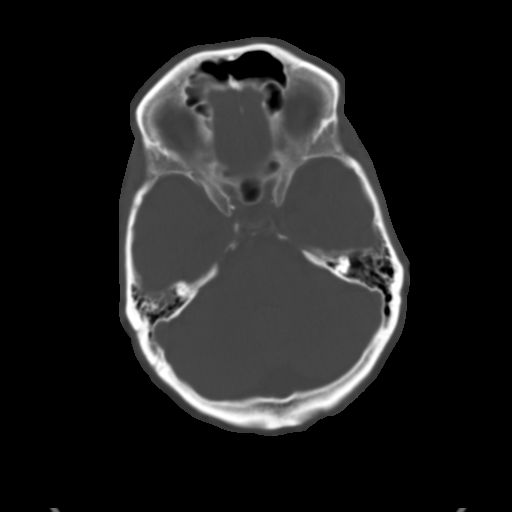
[im 13/36  brain]
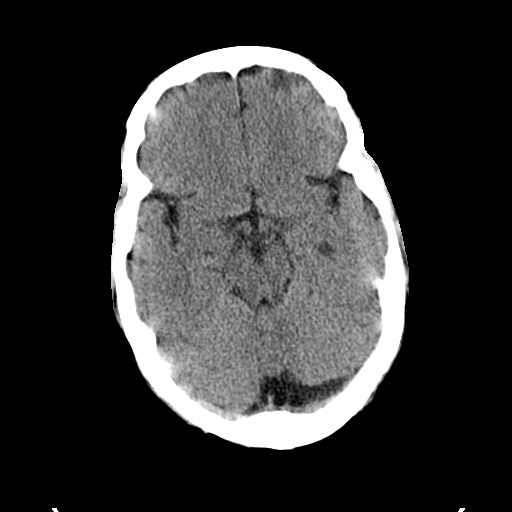
[im 15/36  brain]
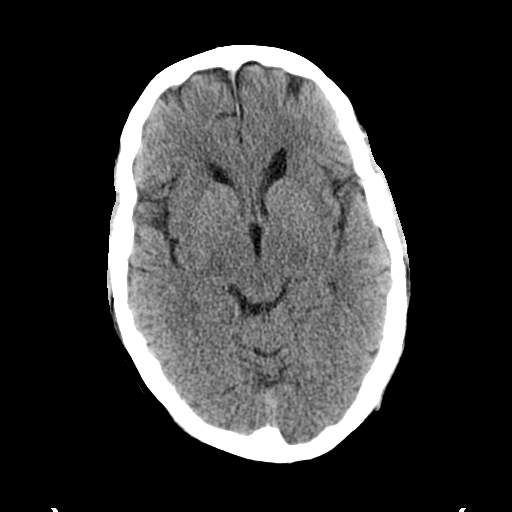
[im 17/36  brain]
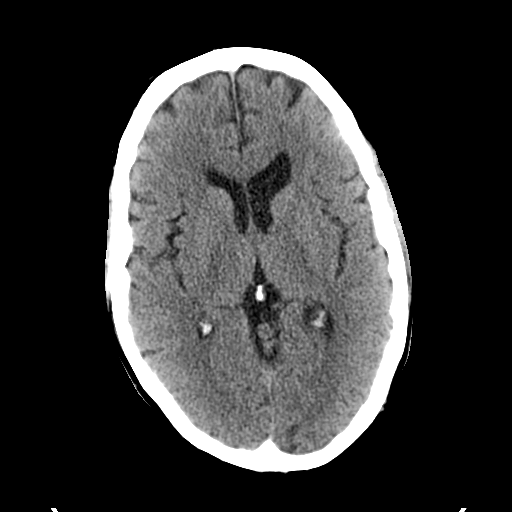
[im 19/36  brain]
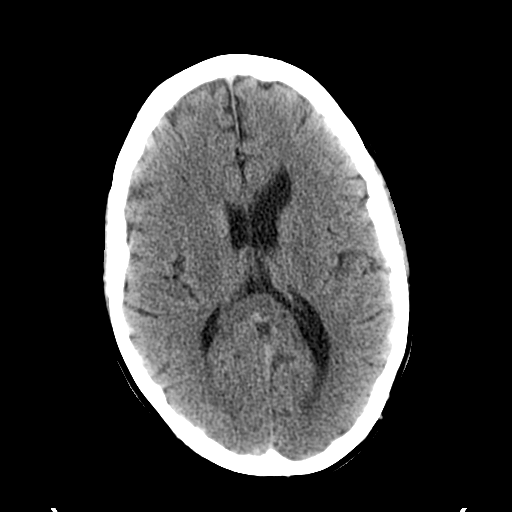
[im 19/36  bone]
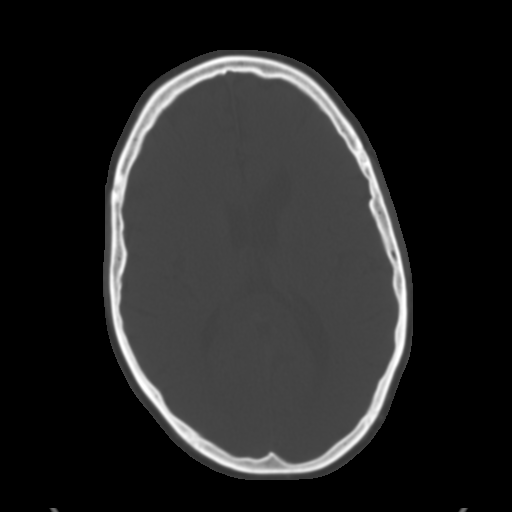
[im 21/36  brain]
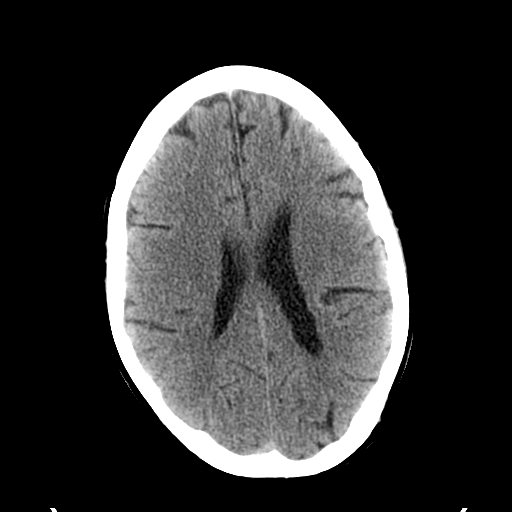
[im 23/36  brain]
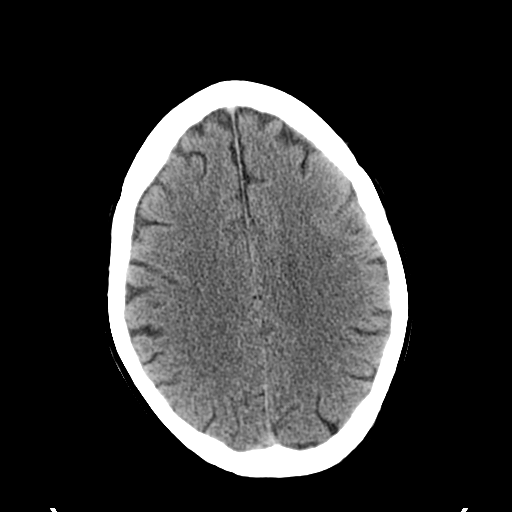
[im 26/36  brain]
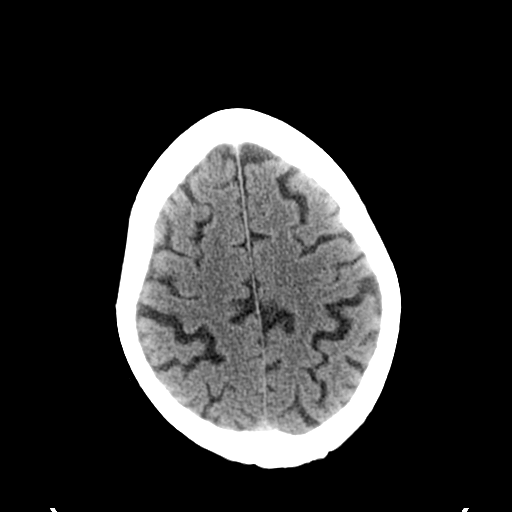
[im 27/36  brain]
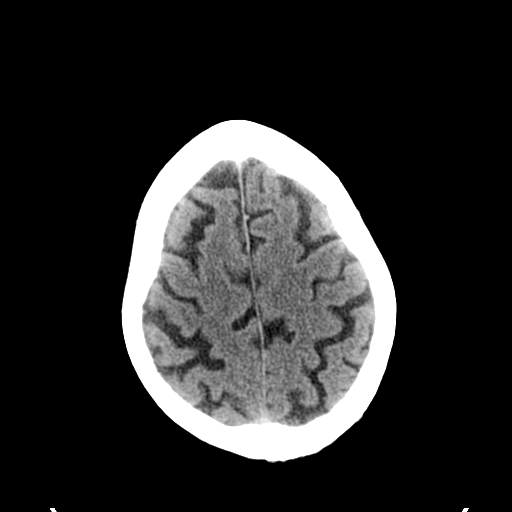
[im 27/36  bone]
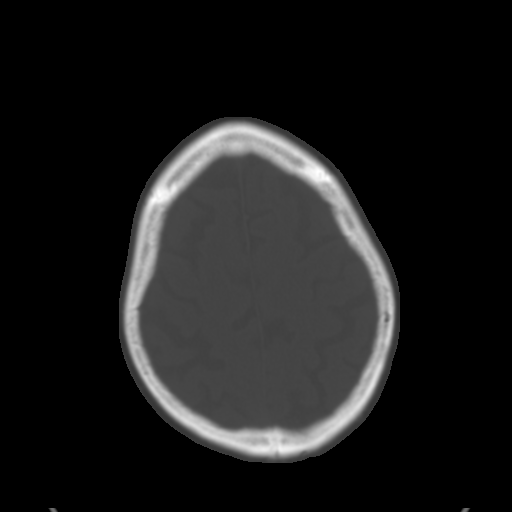
[im 29/36  brain]
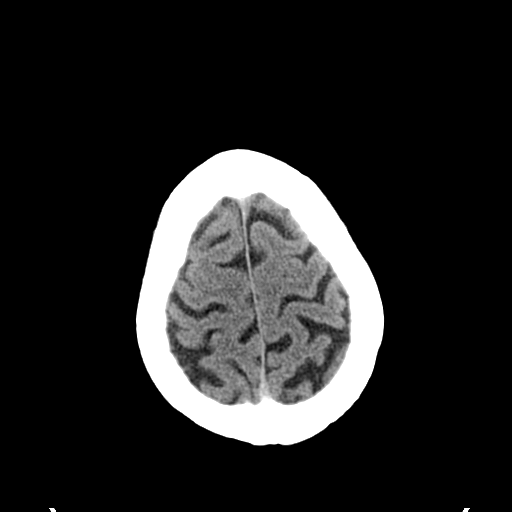
[im 32/36  brain]
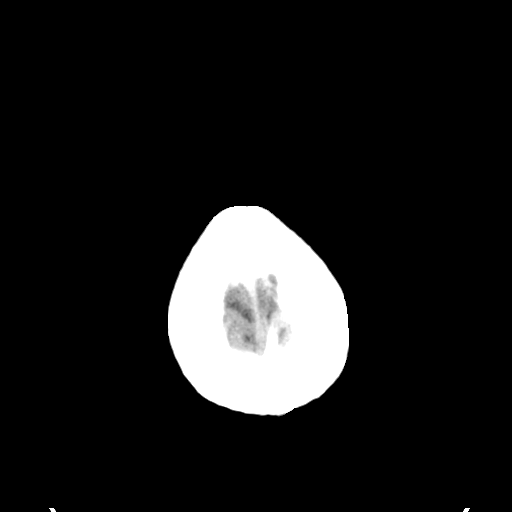
[im 34/36  brain]
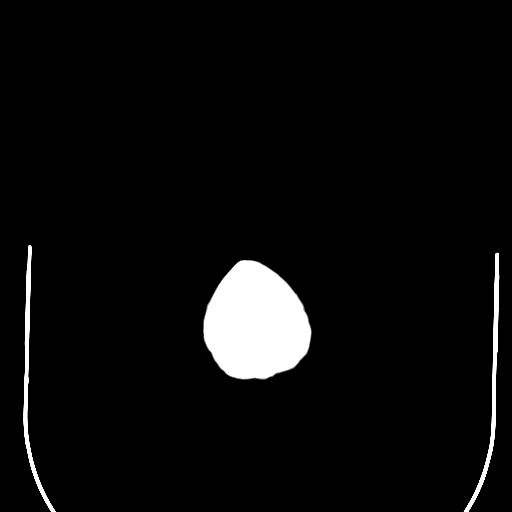

[16 of 30 positions shown; findings below may reference images not displayed]

FINDINGS: The brain stem, cerebellum, cerebral peduncles, thalami,
basal ganglia, basilar cisterns, and ventricular system appear
unremarkable.

No intracranial hemorrhage, mass lesion, or acute infarction is
identified.

Mild atherosclerotic calcification of the carotid siphons noted.
IMPRESSION: 1.  Atherosclerosis.  No acute intracranial findings are
identified.

## 2013-03-30 ENCOUNTER — Ambulatory Visit: Payer: BC Managed Care – PPO | Admitting: Neurosurgery

## 2013-04-06 ENCOUNTER — Ambulatory Visit: Payer: BC Managed Care – PPO | Admitting: Vascular Surgery

## 2013-04-13 ENCOUNTER — Ambulatory Visit: Payer: BC Managed Care – PPO | Admitting: Vascular Surgery

## 2013-04-26 ENCOUNTER — Encounter: Payer: Self-pay | Admitting: Vascular Surgery

## 2013-04-27 ENCOUNTER — Encounter: Payer: Self-pay | Admitting: Vascular Surgery

## 2013-04-27 ENCOUNTER — Encounter (INDEPENDENT_AMBULATORY_CARE_PROVIDER_SITE_OTHER): Payer: Medicare Other | Admitting: Vascular Surgery

## 2013-04-27 ENCOUNTER — Ambulatory Visit (INDEPENDENT_AMBULATORY_CARE_PROVIDER_SITE_OTHER): Payer: Medicare Other | Admitting: Vascular Surgery

## 2013-04-27 VITALS — BP 146/83 | HR 63 | Ht 70.0 in | Wt 176.0 lb

## 2013-04-27 DIAGNOSIS — Z9889 Other specified postprocedural states: Secondary | ICD-10-CM

## 2013-04-27 DIAGNOSIS — Z95828 Presence of other vascular implants and grafts: Secondary | ICD-10-CM

## 2013-04-27 DIAGNOSIS — I70219 Atherosclerosis of native arteries of extremities with intermittent claudication, unspecified extremity: Secondary | ICD-10-CM

## 2013-04-27 DIAGNOSIS — I739 Peripheral vascular disease, unspecified: Secondary | ICD-10-CM

## 2013-04-27 DIAGNOSIS — Z48812 Encounter for surgical aftercare following surgery on the circulatory system: Secondary | ICD-10-CM

## 2013-04-27 NOTE — Progress Notes (Signed)
[facility] [place]  Established Previous Bypass  History of Present Illness  [name] is a 67 y.o. ([birth year]) male who presents with chief complaint: routine surveillance.  Previous operation(s) include: failed fem-fem, L fem-AK, L fem-BK BPG, Aortobifemoral bypass.  The patient was found on the previous fem-fem, fem-pop failures to have a thrombophilia with HITT and SLE anticoagulant.  The patient's symptoms have slightly improved.  The patient's symptoms are: intermittent claudication. The patient's treatment regimen currently included: maximal medical management and exercise.  Past Medical History, Past Surgical History, Social History, Family History, Medications, Allergies, and Review of Systems are unchanged from previous evaluation on 09/29/12.  On ROS: intermittent claudication slightly better.  SOB improved.  COPD sx improved.  Physical Examination  Filed Vitals:   04/27/13 1517  BP: 146/83  Pulse: 63  Height: 5\' 10"  (1.778 m)  Weight: 176 lb (79.833 kg)  SpO2: 96%   Body mass index is 25.25 kg/(m^2).  General: A&O x 3, WDWN  Pulmonary: Sym exp, good air movt, CTAB, no rales, rhonchi, & wheezing  Cardiac: RRR, Nl S1, S2, no Murmurs, rubs or gallops  Vascular: Vessel Right Left  Radial Palpable Palpable  Brachial Palpable Palpable  Carotid Palpable, without bruit Palpable, without bruit  Aorta Non-palpable N/A  Femoral Palpable Palpable  Popliteal Non-palpable Non-palpable  PT Faintly Palpable Faintly Palpable  DP Not Palpable Not Palpable   GI: soft, NTND, incision well healed without hernia  Musculoskeletal: M/S 5/5 throughout , Extremities without ischemic changes   Neurologic: Pain and light touch intact in extremities , Motor exam as listed above  Non-Invasive Vascular Imaging ABI (Date: 04/27/2013)  R: 0.98 (0.91), DP: bi, PT: bi, TBI: 0.52  L: 0.76 (0.73), DP: mono, PT: mono, TBI: 0.53  Medical Decision Making  [name] is a 67 y.o. male who presents with: s/p ABF, s/p multiple failed fem-fem, L fem-pop, thrombophilia, COPD .  Based on the patient's vascular studies and examination, I have offered the patient: continued annual surveillance.  The right tibial arteries appear biphasic on waveform analysis, suggesting some collateralization vs plaque regression.    The patient's sx are improved and non-lifestyle limiting at this point.  Given the multiple procedures already completed, I would minimize any additional procedure to limit the risk of infection to the aortobifemoral bypass.    I discussed in depth with the patient the nature of atherosclerosis, and emphasized the importance of maximal medical management including strict control of blood pressure, blood glucose, and lipid levels, obtaining regular exercise, and cessation of smoking.  The patient is aware that without maximal medical management the underlying atherosclerotic disease process will progress, limiting the benefit of any interventions.  I discussed in depth with the patient the need for long term surveillance to improve the primary assisted patency of his bypass.  The patient agrees to cooperate with such.  The patient is scheduled for the previous listed surveillance studies in 12 months.  Thank you for allowing [place] to participate in this patient's care.  Dennis Sake, MD [facility] [place] Office: [phone] Pager: [phone]  04/27/2013, 5:54 PM

## 2013-05-02 ENCOUNTER — Other Ambulatory Visit: Payer: Self-pay | Admitting: *Deleted

## 2013-05-02 DIAGNOSIS — Z48812 Encounter for surgical aftercare following surgery on the circulatory system: Secondary | ICD-10-CM

## 2013-05-02 DIAGNOSIS — I739 Peripheral vascular disease, unspecified: Secondary | ICD-10-CM

## 2013-05-23 ENCOUNTER — Other Ambulatory Visit: Payer: Self-pay

## 2013-08-23 ENCOUNTER — Other Ambulatory Visit: Payer: Self-pay

## 2013-12-04 ENCOUNTER — Other Ambulatory Visit: Payer: Self-pay | Admitting: Vascular Surgery

## 2013-12-04 DIAGNOSIS — Z48812 Encounter for surgical aftercare following surgery on the circulatory system: Secondary | ICD-10-CM

## 2013-12-04 DIAGNOSIS — I739 Peripheral vascular disease, unspecified: Secondary | ICD-10-CM

## 2014-05-02 ENCOUNTER — Encounter: Payer: Self-pay | Admitting: Vascular Surgery

## 2014-05-03 ENCOUNTER — Encounter: Payer: Self-pay | Admitting: Vascular Surgery

## 2014-05-03 ENCOUNTER — Ambulatory Visit (INDEPENDENT_AMBULATORY_CARE_PROVIDER_SITE_OTHER): Payer: Medicare Other | Admitting: Vascular Surgery

## 2014-05-03 ENCOUNTER — Ambulatory Visit (HOSPITAL_COMMUNITY)
Admission: RE | Admit: 2014-05-03 | Discharge: 2014-05-03 | Disposition: A | Discharge status: Home / Self Care | Payer: Medicare Other | Source: Ambulatory Visit | Attending: Vascular Surgery | Admitting: Vascular Surgery

## 2014-05-03 VITALS — BP 115/68 | HR 84 | Resp 16 | Ht 70.0 in | Wt 173.5 lb

## 2014-05-03 DIAGNOSIS — Z48812 Encounter for surgical aftercare following surgery on the circulatory system: Secondary | ICD-10-CM

## 2014-05-03 DIAGNOSIS — I70219 Atherosclerosis of native arteries of extremities with intermittent claudication, unspecified extremity: Secondary | ICD-10-CM

## 2014-05-03 DIAGNOSIS — I739 Peripheral vascular disease, unspecified: Secondary | ICD-10-CM | POA: Diagnosis present

## 2014-05-03 NOTE — Addendum Note (Signed)
Addended by: Sharee PimpleMCCHESNEY, Arrin Ishler K on: 05/03/2014 02:33 PM   Modules accepted: Orders

## 2014-05-03 NOTE — Progress Notes (Signed)
   Established Previous Bypass   History of Present Illness  [name] is a 68 y.o. male  who presents with chief complaint: routine surveillance. Previous operation(s) include: failed fem-fem, L fem-AK, L fem-BK BP and successful Aortobifemoral bypass. The patient was found on the previous fem-fem, fem-pop failures to have a thrombophilia with HITT and SLE anticoagulant.  The patient's prior symptoms of intermittent claudication are resolved.  The patient is able to run on treadmill for 40 minutes now.  . The patient's treatment regimen currently included: maximal medical management and exercise.   Past Medical History, Past Surgical History, Social History, Family History, Medications, Allergies, and Review of Systems are unchanged from previous evaluation on 04/27/13.  On ROS: intermittent claudication gone, no bleeding complications  Physical Examination  Filed Vitals:   05/03/14 1313  BP: 115/68  Pulse: 84  Resp: 16  Height: 5\' 10"  (1.778 m)  Weight: 173 lb 8 oz (78.699 kg)  SpO2: 91%   Body mass index is 24.89 kg/(m^2).  General: A&O x 3, WDWN   Pulmonary: Sym exp, good air movt, CTAB, no rales, rhonchi, & wheezing   Cardiac: RRR, Nl S1, S2, no Murmurs, rubs or gallops   Vascular:  Vessel  Right  Left   Radial  Palpable  Palpable   Brachial  Palpable  Palpable   Carotid  Palpable, without bruit  Palpable, without bruit   Aorta  Non-palpable  N/A   Femoral  Palpable  Palpable   Popliteal  Non-palpable  Non-palpable   PT  Faintly Palpable  Faintly Palpable   DP  Not Palpable  Faintly Palpable    GI: soft, NTND, incision well healed without hernia   Musculoskeletal: M/S 5/5 throughout , Extremities without ischemic changes   Neurologic: Pain and light touch intact in extremities , Motor exam as listed above   Non-Invasive Vascular Imaging  ABI (Date: 05/03/2014)  R: 0.82 (0.98), DP: mono, PT: bi, TBI: 0.47  L: 0.77 (0.76), DP: bi, PT: bi, TBI:  0.43   Medical Decision Making  [name] is a 68 y.o. male who presents with: s/p ABF, s/p multiple failed fem-fem, L fem-pop, thrombophilia, COPD .   Patient's claudication has resolved with regimen exercise during cardiac rehab.  This suggest collateralization vs plaque regression. Based on the patient's vascular studies and examination, I have offered the patient: continued annual surveillance.  Again, I emphasized avoid any future procedures to limit the risk of infection to the aortobifemoral bypass.  I reiterated the need for antibiotics for dental work or any bacteremia. I discussed in depth with the patient the nature of atherosclerosis, and emphasized the importance of maximal medical management including strict control of blood pressure, blood glucose, and lipid levels, obtaining regular exercise, and cessation of smoking. The patient is aware that without maximal medical management the underlying atherosclerotic disease process will progress, limiting the benefit of any interventions.  I discussed in depth with the patient the need for long term surveillance to improve the primary assisted patency of his bypass. The patient agrees to cooperate with such.  The patient is scheduled for the previous listed surveillance studies in 12 months.  Thank you for allowing us to participate in this patient's care.  Leonides SakeBrian Faiga Stones, MD [facility] [place] Office: [phone] Pager: [phone]  05/03/2014, 2:02 PM

## 2015-05-06 ENCOUNTER — Encounter: Payer: Self-pay | Admitting: Family

## 2015-05-09 ENCOUNTER — Ambulatory Visit (INDEPENDENT_AMBULATORY_CARE_PROVIDER_SITE_OTHER): Payer: Medicare Other | Admitting: Family

## 2015-05-09 ENCOUNTER — Ambulatory Visit (HOSPITAL_COMMUNITY)
Admission: RE | Admit: 2015-05-09 | Discharge: 2015-05-09 | Disposition: A | Discharge status: Home / Self Care | Payer: Medicare Other | Source: Ambulatory Visit | Attending: Vascular Surgery | Admitting: Vascular Surgery

## 2015-05-09 ENCOUNTER — Encounter: Payer: Self-pay | Admitting: Family

## 2015-05-09 VITALS — BP 123/73 | HR 82 | Temp 97.2°F | Resp 16 | Ht 70.5 in | Wt 175.0 lb

## 2015-05-09 DIAGNOSIS — Z48812 Encounter for surgical aftercare following surgery on the circulatory system: Secondary | ICD-10-CM

## 2015-05-09 DIAGNOSIS — Z87891 Personal history of nicotine dependence: Secondary | ICD-10-CM | POA: Diagnosis not present

## 2015-05-09 DIAGNOSIS — Z9889 Other specified postprocedural states: Secondary | ICD-10-CM

## 2015-05-09 DIAGNOSIS — I70219 Atherosclerosis of native arteries of extremities with intermittent claudication, unspecified extremity: Secondary | ICD-10-CM

## 2015-05-09 DIAGNOSIS — Z862 Personal history of diseases of the blood and blood-forming organs and certain disorders involving the immune mechanism: Secondary | ICD-10-CM

## 2015-05-09 DIAGNOSIS — Z4889 Encounter for other specified surgical aftercare: Secondary | ICD-10-CM | POA: Diagnosis not present

## 2015-05-09 DIAGNOSIS — Z95828 Presence of other vascular implants and grafts: Secondary | ICD-10-CM

## 2015-05-09 NOTE — Patient Instructions (Signed)

## 2015-05-09 NOTE — Progress Notes (Signed)
[facility] [place] HISTORY AND PHYSICAL -PAD  History of Present Illness [name] is a 69 y.o. male patient of Dr. Imogene Burn who is s/p failed fem-fem, L fem-AK, L fem-BK BP and successful Aortobifemoral bypass. The patient was found on the previous fem-fem, fem-pop failures to have a thrombophilia with HITT and SLE anticoagulant. The patient's prior symptoms of intermittent claudication are resolved. The patient is able to walk on treadmill for 40 minutes, 3x/week. The patient's treatment regimen currently includes: maximal medical management and exercise.  He has a history of lumbar spine fusion, his feet get numb after standing a while; he denies non healing wounds in LE's.  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  The patient denies New Medical or Surgical History.  Pt Diabetic: No Pt smoker: former smoker, quit 08/13/2010  Pt meds include: Statin :Yes ASA: No Other anticoagulants/antiplatelets: coumadin for history of HIT  Past Medical History  Diagnosis Date  . Clotting disorder   . Hyperlipidemia   . Degenerative disc disease   . Peripheral arterial disease   . Leg fracture, left   . Leg fracture, right   . PVD (peripheral vascular disease)   . COPD (chronic obstructive pulmonary disease)   . Anxiety   . Depression     Social History History  Substance Use Topics  . Smoking status: Former Smoker -- 1.50 packs/day    Quit date: 08/13/2010  . Smokeless tobacco: Former Neurosurgeon  . Alcohol Use: No    Family History Family History  Problem Relation Age of Onset  . Cancer Mother   . Emphysema Mother   . Lung cancer Mother   . Heart disease Father     Heart Disease before age 46  . Cancer Sister   . Cancer Brother   . Diabetes Brother   . Hyperlipidemia Brother   . Hypertension Brother   . Heart disease Brother     Heart Disease before age 73    Past Surgical History  Procedure Laterality Date  .  Redo r to l femoral-femoral bypass  10/27/10  . Iliofemoral endarterectomy with bovine patch angioplasty  10/27/10  . Thombectomy and fem pop bypass 08/17/10    . Aotogram  07/30/10  . Neck surgery    . Cardiovascular stress test  08/03/2010    EF 53%. NO ISCHEMIA  . Fracture surgery      pins left femur  . Aorta - bilateral femoral artery bypass graft  11/10/2011    Procedure: AORTA BIFEMORAL BYPASS GRAFT;  Surgeon: Nilda Simmer, MD;  Location: [facility] OR;  Service: Vascular;  Laterality: N/A;  . Pr vein bypass graft,aorto-fem-pop      Allergies  Allergen Reactions  . Ciprofloxacin Hcl Shortness Of Breath and Rash    fever  . Vancomycin Shortness Of Breath and Rash    fever  . Heparin Other (See Comments)    Blood-clots and bleeding.    Current Outpatient Prescriptions  Medication Sig Dispense Refill  . albuterol (PROVENTIL HFA;VENTOLIN HFA) 108 (90 BASE) MCG/ACT inhaler Inhale 2 puffs into the lungs every 6 (six) hours as needed. For shortness of breath.     Marland Kitchen atorvastatin (LIPITOR) 40 MG tablet Take 40 mg by mouth at bedtime.     . budesonide-formoterol (SYMBICORT) 160-4.5 MCG/ACT inhaler Inhale 2 puffs into the lungs 2 (two) times daily.      . Calcium-Vitamin D-Vitamin K [id number] MG-UNT-MCG CHEW Chew  2 tablets by mouth at bedtime.     Marland Kitchen CIALIS 20 MG tablet Take 20 mg by mouth daily as needed. For erectile dysfunction.    . ferrous fumarate (HEMOCYTE - 106 MG FE) 325 (106 FE) MG TABS Take 1 tablet by mouth at bedtime.     . sertraline (ZOLOFT) 50 MG tablet Take 50 mg by mouth at bedtime.     . theophylline (THEODUR) 100 MG 12 hr tablet Take 100 mg by mouth 3 (three) times daily.    Marland Kitchen tiotropium (SPIRIVA) 18 MCG inhalation capsule Place 18 mcg into inhaler and inhale daily.      . traZODone (DESYREL) 50 MG tablet Take 25 mg by mouth at bedtime.     Marland Kitchen warfarin (COUMADIN) 1 MG tablet Take 2 mg by mouth See admin instructions. Takes on Mon, Tues, Wed, Fri and Sun    . warfarin  (COUMADIN) 3 MG tablet Take 3 mg by mouth See admin instructions. Takes 3mg  on Thurs & Sat and 2mg  on all other days     No current facility-administered medications for this visit.    ROS: See HPI for pertinent positives and negatives.   Physical Examination  Filed Vitals:   05/09/15 1341  BP: 123/73  Pulse: 82  Temp: 97.2 F (36.2 C)  TempSrc: Oral  Resp: 16  Height: 5' 10.5" (1.791 m)  Weight: 175 lb (79.379 kg)  SpO2: 91%   Body mass index is 24.75 kg/(m^2).   General: A&O x 3, WDWN   Pulmonary: Sym exp, fair air movt, CTAB, no rales, rhonchi, & wheezing. Occassional moist cough.  Cardiac: RRR, no detected murmur  Vascular:  Vessel  Right  Left   Radial  Palpable  Palpable   Carotid  Palpable, with bruit  Palpable, without bruit   Aorta  Non-palpable  N/A   Femoral  Palpable  Palpable   Popliteal  Non-palpable  Non-palpable   PT  1+Palpable  not Palpable   DP  Not Palpable  not Palpable    GI: soft, NTND, incision well healed without hernia   Musculoskeletal: M/S 5/5 throughout , Extremities without ischemic changes   Neurologic: Pain and light touch intact in extremities , Motor exam as listed above           Non-Invasive Vascular Imaging: DATE: 05/09/2015 ABI: RIGHT: 0.90 (05/03/14, 0.82), Waveforms: monophasic, TBI: 0.51 (0.47);  LEFT: 0.70 (0.77), Waveforms: monophasic; TBI: 0.64 (0.43)   ASSESSMENT: [name] is a 69 y.o. male who is s/p failed fem-fem, L fem-AK, L fem-BK BP and successful Aortobifemoral bypass. The patient was found on the previous fem-fem, fem-pop failures to have a thrombophilia with HITT and SLE anticoagulant. The patient's prior symptoms of intermittent claudication are resolved. The patient is able to walk on treadmill for 40 minutes in cardiac rehab, 3x/week. ABI's today remain stable from a year ago: mild bilateral arterial occlusive disease, all monophasic waveforms.   The numbness in his  feet and tired feeling in his legs is less likely due to mild PAD and more likely from his known lumbar spine issues. Pt states his walking is also limited by his dyspnea.   PLAN:   Based on the patient's vascular studies and examination, pt will return to clinic in 1 year with ABI's, he knows to returns sooner if needed.  I discussed in depth with the patient the nature of atherosclerosis, and emphasized the importance of maximal medical management including strict control of blood pressure, blood  glucose, and lipid levels, obtaining regular exercise, and continued cessation of smoking.  The patient is aware that without maximal medical management the underlying atherosclerotic disease process will progress, limiting the benefit of any interventions.  The patient was given information about PAD including signs, symptoms, treatment, what symptoms should prompt the patient to seek immediate medical care, and risk reduction measures to take.  Charisse March, RN, MSN, FNP-C [facility] [place] Phone: [phone]  Clinic MD: Imogene Burn  05/09/2015 1:36 PM

## 2016-05-06 ENCOUNTER — Encounter: Payer: Self-pay | Admitting: Family

## 2016-05-11 ENCOUNTER — Other Ambulatory Visit: Payer: Self-pay | Admitting: *Deleted

## 2016-05-11 DIAGNOSIS — Z48812 Encounter for surgical aftercare following surgery on the circulatory system: Secondary | ICD-10-CM

## 2016-05-11 DIAGNOSIS — I739 Peripheral vascular disease, unspecified: Secondary | ICD-10-CM

## 2016-05-14 ENCOUNTER — Encounter: Payer: Self-pay | Admitting: Family

## 2016-05-14 ENCOUNTER — Ambulatory Visit (INDEPENDENT_AMBULATORY_CARE_PROVIDER_SITE_OTHER): Payer: Medicare Other | Admitting: Family

## 2016-05-14 ENCOUNTER — Ambulatory Visit (HOSPITAL_COMMUNITY)
Admission: RE | Admit: 2016-05-14 | Discharge: 2016-05-14 | Disposition: A | Discharge status: Home / Self Care | Payer: Medicare Other | Source: Ambulatory Visit | Attending: Family | Admitting: Family

## 2016-05-14 VITALS — BP 140/70 | HR 65 | Temp 97.3°F | Resp 20 | Ht 70.0 in | Wt 171.0 lb

## 2016-05-14 DIAGNOSIS — F329 Major depressive disorder, single episode, unspecified: Secondary | ICD-10-CM | POA: Diagnosis not present

## 2016-05-14 DIAGNOSIS — Z87891 Personal history of nicotine dependence: Secondary | ICD-10-CM

## 2016-05-14 DIAGNOSIS — J449 Chronic obstructive pulmonary disease, unspecified: Secondary | ICD-10-CM | POA: Insufficient documentation

## 2016-05-14 DIAGNOSIS — I2510 Atherosclerotic heart disease of native coronary artery without angina pectoris: Secondary | ICD-10-CM | POA: Diagnosis not present

## 2016-05-14 DIAGNOSIS — F419 Anxiety disorder, unspecified: Secondary | ICD-10-CM | POA: Diagnosis not present

## 2016-05-14 DIAGNOSIS — Z95828 Presence of other vascular implants and grafts: Secondary | ICD-10-CM | POA: Diagnosis not present

## 2016-05-14 DIAGNOSIS — Z48812 Encounter for surgical aftercare following surgery on the circulatory system: Secondary | ICD-10-CM

## 2016-05-14 DIAGNOSIS — I739 Peripheral vascular disease, unspecified: Secondary | ICD-10-CM | POA: Diagnosis not present

## 2016-05-14 DIAGNOSIS — Z4889 Encounter for other specified surgical aftercare: Secondary | ICD-10-CM

## 2016-05-14 DIAGNOSIS — E785 Hyperlipidemia, unspecified: Secondary | ICD-10-CM | POA: Insufficient documentation

## 2016-05-14 DIAGNOSIS — I779 Disorder of arteries and arterioles, unspecified: Secondary | ICD-10-CM

## 2016-05-14 DIAGNOSIS — Z862 Personal history of diseases of the blood and blood-forming organs and certain disorders involving the immune mechanism: Secondary | ICD-10-CM | POA: Diagnosis not present

## 2016-05-14 DIAGNOSIS — R0989 Other specified symptoms and signs involving the circulatory and respiratory systems: Secondary | ICD-10-CM | POA: Diagnosis present

## 2016-05-14 NOTE — Patient Instructions (Signed)
Peripheral Vascular Disease Peripheral vascular disease (PVD) is a disease of the blood vessels that are not part of your heart and brain. A simple term for PVD is poor circulation. In most cases, PVD narrows the blood vessels that carry blood from your heart to the rest of your body. This can result in a decreased supply of blood to your arms, legs, and internal organs, like your stomach or kidneys. However, it most often affects a person's lower legs and feet. There are two types of PVD.  Organic PVD. This is the more common type. It is caused by damage to the structure of blood vessels.  Functional PVD. This is caused by conditions that make blood vessels contract and tighten (spasm). Without treatment, PVD tends to get worse over time. PVD can also lead to acute ischemic limb. This is when an arm or limb suddenly has trouble getting enough blood. This is a medical emergency. CAUSES Each type of PVD has many different causes. The most common cause of PVD is buildup of a fatty material (plaque) inside of your arteries (atherosclerosis). Small amounts of plaque can break off from the walls of the blood vessels and become lodged in a smaller artery. This blocks blood flow and can cause acute ischemic limb. Other common causes of PVD include:  Blood clots that form inside of blood vessels.  Injuries to blood vessels.  Diseases that cause inflammation of blood vessels or cause blood vessel spasms.  Health behaviors and health history that increase your risk of developing PVD. RISK FACTORS  You may have a greater risk of PVD if you:  Have a family history of PVD.  Have certain medical conditions, including:  High cholesterol.  Diabetes.  High blood pressure (hypertension).  Coronary heart disease.  Past problems with blood clots.  Past injury, such as burns or a broken bone. These may have damaged blood vessels in your limbs.  Buerger disease. This is caused by inflamed blood  vessels in your hands and feet.  Some forms of arthritis.  Rare birth defects that affect the arteries in your legs.  Use tobacco.  Do not get enough exercise.  Are obese.  Are age 50 or older. SIGNS AND SYMPTOMS  PVD may cause many different symptoms. Your symptoms depend on what part of your body is not getting enough blood. Some common signs and symptoms include:  Cramps in your lower legs. This may be a symptom of poor leg circulation (claudication).  Pain and weakness in your legs while you are physically active that goes away when you rest (intermittent claudication).  Leg pain when at rest.  Leg numbness, tingling, or weakness.  Coldness in a leg or foot, especially when compared with the other leg.  Skin or hair changes. These can include:  Hair loss.  Shiny skin.  Pale or bluish skin.  Thick toenails.  Inability to get or maintain an erection (erectile dysfunction). People with PVD are more prone to developing ulcers and sores on their toes, feet, or legs. These may take longer than normal to heal. DIAGNOSIS Your health care provider may diagnose PVD from your signs and symptoms. The health care provider will also do a physical exam. You may have tests to find out what is causing your PVD and determine its severity. Tests may include:  Blood pressure recordings from your arms and legs and measurements of the strength of your pulses (pulse volume recordings).  Imaging studies using sound waves to take pictures of   the blood flow through your blood vessels (Doppler ultrasound).  Injecting a dye into your blood vessels before having imaging studies using:  X-rays (angiogram or arteriogram).  Computer-generated X-rays (CT angiogram).  A powerful electromagnetic field and a computer (magnetic resonance angiogram or MRA). TREATMENT Treatment for PVD depends on the cause of your condition and the severity of your symptoms. It also depends on your age. Underlying  causes need to be treated and controlled. These include long-lasting (chronic) conditions, such as diabetes, high cholesterol, and high blood pressure. You may need to first try making lifestyle changes and taking medicines. Surgery may be needed if these do not work. Lifestyle changes may include:  Quitting smoking.  Exercising regularly.  Following a low-fat, low-cholesterol diet. Medicines may include:  Blood thinners to prevent blood clots.  Medicines to improve blood flow.  Medicines to improve your blood cholesterol levels. Surgical procedures may include:  A procedure that uses an inflated balloon to open a blocked artery and improve blood flow (angioplasty).  A procedure to put in a tube (stent) to keep a blocked artery open (stent implant).  Surgery to reroute blood flow around a blocked artery (peripheral bypass surgery).  Surgery to remove dead tissue from an infected wound on the affected limb.  Amputation. This is surgical removal of the affected limb. This may be necessary in cases of acute ischemic limb that are not improved through medical or surgical treatments. HOME CARE INSTRUCTIONS  Take medicines only as directed by your health care provider.  Do not use any tobacco products, including cigarettes, chewing tobacco, or electronic cigarettes. If you need help quitting, ask your health care provider.  Lose weight if you are overweight, and maintain a healthy weight as directed by your health care provider.  Eat a diet that is low in fat and cholesterol. If you need help, ask your health care provider.  Exercise regularly. Ask your health care provider to suggest some good activities for you.  Use compression stockings or other mechanical devices as directed by your health care provider.  Take good care of your feet.  Wear comfortable shoes that fit well.  Check your feet often for any cuts or sores. SEEK MEDICAL CARE IF:  You have cramps in your legs  while walking.  You have leg pain when you are at rest.  You have coldness in a leg or foot.  Your skin changes.  You have erectile dysfunction.  You have cuts or sores on your feet that are not healing. SEEK IMMEDIATE MEDICAL CARE IF:  Your arm or leg turns cold and blue.  Your arms or legs become red, warm, swollen, painful, or numb.  You have chest pain or trouble breathing.  You suddenly have weakness in your face, arm, or leg.  You become very confused or lose the ability to speak.  You suddenly have a very bad headache or lose your vision.   This information is not intended to replace advice given to you by your health care provider. Make sure you discuss any questions you have with your health care provider.   Document Released: 11/11/2004 Document Revised: 10/25/2014 Document Reviewed: 03/14/2014 Elsevier Interactive Patient Education 2016 [employer]  

## 2016-05-14 NOTE — Progress Notes (Signed)
[facility] [place]   CC: Follow up peripheral artery occlusive disease  History of Present Illness [name] is a 70 y.o. male patient of Dr. Imogene Burn who is s/p failed fem-fem, L fem-AK, L fem-BK BP and successful Aortobifemoral bypass. 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 The patient's prior symptoms of intermittent claudication are resolved. The patient is able to walk on treadmill for 40 minutes, 3x/week. The patient's treatment regimen currently includes: maximal medical management and exercise.  He has a history of lumbar spine fusion, his feet get numb after standing a while; he denies non healing wounds in LE's.  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  He performs pulmonary rehab 3 days/week for 2-3 years, has COPD.   Pt Diabetic: No Pt smoker: former smoker, quit 08/13/2010  Pt meds include: Statin :Yes ASA: No Other anticoagulants/antiplatelets: coumadin for history of HIT    Past Medical History:  Diagnosis Date  . Anxiety   . Clotting disorder (HCC)   . COPD (chronic obstructive pulmonary disease) (HCC)   . Coronary artery disease   . Degenerative disc disease   . Depression   . DVT (deep venous thrombosis) (HCC)   . Hyperlipidemia   . Leg fracture, left   . Leg fracture, right   . Peripheral arterial disease (HCC)   . PVD (peripheral vascular disease) (HCC)     Social History Social History  Substance Use Topics  . Smoking status: Former Smoker    Packs/day: 1.50    Quit date: 08/13/2010  . Smokeless tobacco: Former Neurosurgeon  . Alcohol use No    Family History Family History  Problem Relation Age of Onset  . Cancer Mother   . Emphysema Mother   . Lung cancer Mother   . Heart disease Father     Heart Disease before age 42  . Cancer Sister   . Cancer Brother   . Diabetes Brother   . Hyperlipidemia  Brother   . Hypertension Brother   . Heart disease Brother     Heart Disease before age 55    Past Surgical History:  Procedure Laterality Date  . AORTA - BILATERAL FEMORAL ARTERY BYPASS GRAFT  11/10/2011   Procedure: AORTA BIFEMORAL BYPASS GRAFT;  Surgeon: Nilda Simmer, MD;  Location: [facility] OR;  Service: Vascular;  Laterality: N/A;  . aotogram  07/30/10  . CARDIOVASCULAR STRESS TEST  08/03/2010   EF 53%. NO ISCHEMIA  . FRACTURE SURGERY     pins left femur  . iliofemoral endarterectomy with bovine patch angioplasty  10/27/10  . NECK SURGERY    . PR VEIN BYPASS GRAFT,AORTO-FEM-POP    . redo r to l femoral-femoral bypass  10/27/10  . thombectomy and fem pop bypass 08/17/10      Allergies  Allergen Reactions  . Ciprofloxacin Hcl Shortness Of Breath and Rash    fever  . Vancomycin Shortness Of Breath and Rash    fever  . Heparin Other (See Comments)    Blood-clots and bleeding.    Current Outpatient Prescriptions  Medication Sig Dispense Refill  . albuterol (PROVENTIL HFA;VENTOLIN HFA) 108 (90 BASE) MCG/ACT inhaler Inhale 2 puffs into the lungs every 6 (six) hours as needed. For shortness of breath.     Marland Kitchen atorvastatin (LIPITOR) 40 MG tablet Take 40 mg by mouth at bedtime.     Marland Kitchen  budesonide-formoterol (SYMBICORT) 160-4.5 MCG/ACT inhaler Inhale 2 puffs into the lungs 2 (two) times daily.      . Calcium-Vitamin D-Vitamin K [id number] MG-UNT-MCG CHEW Chew 2 tablets by mouth at bedtime.     Marland Kitchen CIALIS 20 MG tablet Take 20 mg by mouth daily as needed. For erectile dysfunction.    . ferrous fumarate (HEMOCYTE - 106 MG FE) 325 (106 FE) MG TABS Take 1 tablet by mouth at bedtime.     . sertraline (ZOLOFT) 50 MG tablet Take 50 mg by mouth at bedtime.     . theophylline (THEODUR) 100 MG 12 hr tablet Take 100 mg by mouth 3 (three) times daily.    Marland Kitchen tiotropium (SPIRIVA) 18 MCG inhalation capsule Place 18 mcg into inhaler and inhale daily.      . traZODone (DESYREL) 50 MG tablet Take 25 mg by  mouth at bedtime.     Marland Kitchen warfarin (COUMADIN) 1 MG tablet Take 2 mg by mouth See admin instructions. Takes on  Tues, Wed, Thurs Sat and Sun    . warfarin (COUMADIN) 3 MG tablet Take 3 mg by mouth See admin instructions. Takes  on Monday and Friday   on all other days     No current facility-administered medications for this visit.     ROS: See HPI for pertinent positives and negatives.   Physical Examination  Vitals:   05/14/16 1315  BP: 140/70  Pulse: 65  Resp: 20  Temp: 97.3 F (36.3 C)  TempSrc: Oral  SpO2: 90%  Weight: 171 lb (77.6 kg)  Height:  (1.778 m)   Body mass index is 24.54 kg/m.  General: A&O x 3, WDWN   Pulmonary: Sym exp, fair air movt, CTAB, no rales, rhonchi, or wheezing. Occassional moist cough.  Cardiac: RRR, no detected murmur  Vascular:  Vessel  Right  Left   Radial  Palpable  Palpable   Carotid  Palpable, no bruit  Palpable, no bruit   Aorta  Non-palpable  N/A   Femoral  2+Palpable  2+Palpable   Popliteal  Non-palpable  Non-palpable   PT  1+Palpable  1+ Palpable   DP  1+ Palpable  1+Palpable    GI: soft, NTND, incision well healed without hernia   Musculoskeletal: M/S 5/5 throughout , Extremities without ischemic changes   Neurologic: Pain and light touch intact in extremities , Motor exam as listed above     ASSESSMENT: [name] is a 70 y.o. male who is s/p failed fem-fem, L fem-AK, L fem-BK BP and successful Aortobifemoral bypass. 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 The patient's prior symptoms of intermittent claudication are resolved. The patient is able to walk on treadmill for 40 minutes in pulmonary rehab, 3x/week. ABI's today remain stable from a year ago: mild arterial occlusive disease in the right LE with biphasic waveforms, moderate in the left with monophasic waveforms.    PLAN:  Based on  the patient's vascular studies and examination, pt will return to clinic in 1 year with ABI's.  Graduated walking program discussed and how to achieve.   I discussed in depth with the patient the nature of atherosclerosis, and emphasized the importance of maximal medical management including strict control of blood pressure, blood glucose, and lipid levels, obtaining regular exercise, and continued cessation of smoking.  The patient is aware that without maximal medical management the underlying atherosclerotic disease process will progress, limiting the  benefit of any interventions.  The patient was given information about PAD including signs, symptoms, treatment, what symptoms should prompt the patient to seek immediate medical care, and risk reduction measures to take.  Charisse March, RN, MSN, FNP-C [facility] [place] Phone: [phone]  Clinic MD: Imogene Burn  05/14/16 1:23 PM

## 2016-05-19 ENCOUNTER — Other Ambulatory Visit: Payer: Self-pay | Admitting: *Deleted

## 2016-05-19 DIAGNOSIS — I739 Peripheral vascular disease, unspecified: Secondary | ICD-10-CM

## 2017-01-20 ENCOUNTER — Telehealth (HOSPITAL_COMMUNITY): Payer: Self-pay

## 2017-01-20 NOTE — Telephone Encounter (Signed)
Called patient in regards to Pulmonary Rehab referral from [facility]. Patient states he still needs to discuss time conflict with employer. When he is ready to come to Rehab he will follow up with [place].

## 2017-05-09 ENCOUNTER — Encounter: Payer: Self-pay | Admitting: Family

## 2017-05-20 ENCOUNTER — Ambulatory Visit (INDEPENDENT_AMBULATORY_CARE_PROVIDER_SITE_OTHER): Payer: Medicare Other | Admitting: Family

## 2017-05-20 ENCOUNTER — Encounter: Payer: Self-pay | Admitting: Family

## 2017-05-20 ENCOUNTER — Ambulatory Visit (HOSPITAL_COMMUNITY)
Admission: RE | Admit: 2017-05-20 | Discharge: 2017-05-20 | Disposition: A | Discharge status: Home / Self Care | Payer: Medicare Other | Source: Ambulatory Visit | Attending: Family | Admitting: Family

## 2017-05-20 VITALS — BP 129/74 | HR 77 | Temp 97.8°F | Resp 20 | Ht 70.0 in | Wt 160.5 lb

## 2017-05-20 DIAGNOSIS — Z87891 Personal history of nicotine dependence: Secondary | ICD-10-CM

## 2017-05-20 DIAGNOSIS — Z95828 Presence of other vascular implants and grafts: Secondary | ICD-10-CM | POA: Diagnosis not present

## 2017-05-20 DIAGNOSIS — I779 Disorder of arteries and arterioles, unspecified: Secondary | ICD-10-CM

## 2017-05-20 DIAGNOSIS — I739 Peripheral vascular disease, unspecified: Secondary | ICD-10-CM | POA: Diagnosis present

## 2017-05-20 DIAGNOSIS — Z862 Personal history of diseases of the blood and blood-forming organs and certain disorders involving the immune mechanism: Secondary | ICD-10-CM

## 2017-05-20 NOTE — Progress Notes (Signed)
[facility] [place]   CC: Follow up peripheral artery occlusive disease  History of Present Illness Debbe OdeaRobert L Trinkle is a 71 y.o. male patient of Dr. Imogene Burnhen who is s/p failed fem-fem, L fem-AK, L fem-BK BP and successful Aortobifemoral bypass. 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 The patient's prior symptoms of intermittent claudication are resolved. The patient was able to walk on treadmill for 40 minutes, 3x/week, has a new job and has found it difficult to resume exercising. The patient's treatment regimen currently includes: maximal medical management. He has a history of cervical spine fusion, his feet get numb after standing a while; he denies non healing wounds in LE's. He states that he needs another c-spine fusion. 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  He was performing pulmonary rehab 3 days/week for 2-3 years, has COPD.   He does not have claudication symptoms with walking, but has not been walking much due to numbness in feet and dyspnea from COPD.   Pt Diabetic: No Pt smoker: former smoker, quit 08/13/2010  Pt meds include: Statin :Yes ASA: No Other anticoagulants/antiplatelets: coumadin for history of HIT   Past Medical History:  Diagnosis Date  . Anxiety   . Clotting disorder (HCC)   . COPD (chronic obstructive pulmonary disease) (HCC)   . Coronary artery disease   . Degenerative disc disease   . Depression   . DVT (deep venous thrombosis) (HCC)   . Hyperlipidemia   . Leg fracture, left   . Leg fracture, right   . Peripheral arterial disease (HCC)   . PVD (peripheral vascular disease) (HCC)     Social History Social History  Substance Use Topics  . Smoking status: Former Smoker    Packs/day: 1.50    Quit date: 08/13/2010  . Smokeless tobacco: Former NeurosurgeonUser  . Alcohol use No    Family History Family History   Problem Relation Age of Onset  . Cancer Mother   . Emphysema Mother   . Lung cancer Mother   . Heart disease Father        Heart Disease before age 70  . Cancer Sister   . Cancer Brother   . Diabetes Brother   . Hyperlipidemia Brother   . Hypertension Brother   . Heart disease Brother        Heart Disease before age 70    Past Surgical History:  Procedure Laterality Date  . AORTA - BILATERAL FEMORAL ARTERY BYPASS GRAFT  11/10/2011   Procedure: AORTA BIFEMORAL BYPASS GRAFT;  Surgeon: Nilda SimmerBrian Liang-Yu Chen, MD;  [facility];  Service: Vascular;  Laterality: N/A;  . aotogram  07/30/10  . CARDIOVASCULAR STRESS TEST  08/03/2010   EF 53%. NO ISCHEMIA  . FRACTURE SURGERY     pins left femur  . iliofemoral endarterectomy with bovine patch angioplasty  10/27/10  . NECK SURGERY    . PR VEIN BYPASS GRAFT,AORTO-FEM-POP    . redo r to l femoral-femoral bypass  10/27/10  . thombectomy and fem pop bypass 08/17/10      Allergies  Allergen Reactions  . Ciprofloxacin Hcl Shortness Of Breath and Rash    fever  . Vancomycin Shortness Of Breath and Rash    fever  . Heparin Other (See Comments)    Blood-clots and bleeding.    Current Outpatient Prescriptions  Medication Sig Dispense Refill  .  albuterol (PROVENTIL HFA;VENTOLIN HFA) 108 (90 BASE) MCG/ACT inhaler Inhale 2 puffs into the lungs every 6 (six) hours as needed. For shortness of breath.     Marland Kitchen atorvastatin (LIPITOR) 40 MG tablet Take 40 mg by mouth at bedtime.     . budesonide-formoterol (SYMBICORT) 160-4.5 MCG/ACT inhaler Inhale 2 puffs into the lungs 2 (two) times daily.      . Calcium-Vitamin D-Vitamin K [id number] MG-UNT-MCG CHEW Chew 2 tablets by mouth at bedtime.     Marland Kitchen CIALIS 20 MG tablet Take 20 mg by mouth daily as needed. For erectile dysfunction.    . ferrous fumarate (HEMOCYTE - 106 MG FE) 325 (106 FE) MG TABS Take 1 tablet by mouth at bedtime.     . sertraline (ZOLOFT) 50 MG tablet Take 50 mg by mouth at bedtime.      . theophylline (THEODUR) 100 MG 12 hr tablet Take 100 mg by mouth 3 (three) times daily.    Marland Kitchen tiotropium (SPIRIVA) 18 MCG inhalation capsule Place 18 mcg into inhaler and inhale daily.      . traZODone (DESYREL) 50 MG tablet Take 25 mg by mouth at bedtime.     Marland Kitchen warfarin (COUMADIN) 1 MG tablet Take 2 mg by mouth See admin instructions. Takes on  Tues, Wed, Thurs Sat and Sun    . warfarin (COUMADIN) 3 MG tablet Take 3 mg by mouth See admin instructions. Takes 3mg  on Monday and Friday  2mg  on all other days     No current facility-administered medications for this visit.     ROS: See HPI for pertinent positives and negatives.   Physical Examination  Vitals:   05/20/17 1035  BP: 129/74  Pulse: 77  Resp: 20  Temp: 97.8 F (36.6 C)  TempSrc: Oral  SpO2: 91%  Weight: 160 lb 8 oz (72.8 kg)  Height: 5\' 10"  (1.778 m)   Body mass index is 23.03 kg/m.  General: A&O x 3, WDWN   Pulmonary: Sym exp, respirations are labored with mild exertion, fair air movt, CTAB, no rales, rhonchi, or wheezing. Occassional moist cough.  Cardiac: RRR, no detected murmur  Vascular: Vessel  Right  Left   Radial  Palpable  Palpable   Carotid  Palpable, no bruit  Palpable, no bruit   Aorta  Non-palpable  N/A   Femoral  2+Palpable  2+Palpable   Popliteal  Non-palpable  2+palpable   PT  Not Palpable  2+ Palpable   DP  Not Palpable  Not Palpable    GI: soft, NTND, incision well healed without hernia   Musculoskeletal: M/S 5/5 throughout , Extremities without ischemic changes   Neurologic: Pain and light touch intact in extremities , Motor exam as listed above       ASSESSMENT: ROWAN BLAKER is a 71 y.o. male who is s/p failed fem-fem, L fem-AK, L fem-BK BP and successful Aortobifemoral bypass. 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 The patient's prior symptoms of intermittent  claudication are resolved.   His walking is limited by dyspnea from known COPD and numbness in his feet from what he believes are c-spine issues.    DATA   ABI (Date: 05/20/2017)  R:   ABI: 0.83 (was 0.89 on 05-14-16),   PT: bi  DP: mono  TBI:  0.63  L:   ABI: 0.74 (was 0.75),   PT: bi  DP: bi  TBI: 0.50 Stable bilateral ABI:  mild arterial occlusive disease on the right, moderate on the left.   PLAN:  Based on the patient's vascular studies and examination, pt will return to clinic in 1 year with ABI's.  Graduated walking program discussed and how to achieve.     I discussed in depth with the patient the nature of atherosclerosis, and emphasized the importance of maximal medical management including strict control of blood pressure, blood glucose, and lipid levels, obtaining regular exercise, and continued cessation of smoking.  The patient is aware that without maximal medical management the underlying atherosclerotic disease process will progress, limiting the benefit of any interventions.  The patient was given information about PAD including signs, symptoms, treatment, what symptoms should prompt the patient to seek immediate medical care, and risk reduction measures to take.  Charisse MarchSuzanne Nickel, RN, MSN, FNP-C [facility] [place] Office Phone: [phone]  Clinic MD: Randie HeinzCain  05/20/17 11:04 AM

## 2017-05-20 NOTE — Patient Instructions (Signed)

## 2017-05-23 NOTE — Addendum Note (Signed)
Addended by: Burton ApleyPETTY, Shayann Garbutt A on: 05/23/2017 03:35 PM   Modules accepted: Orders

## 2018-06-16 ENCOUNTER — Encounter (HOSPITAL_COMMUNITY): Payer: Medicare Other

## 2018-06-16 ENCOUNTER — Ambulatory Visit: Payer: Medicare Other | Admitting: Family

## 2018-07-13 ENCOUNTER — Encounter: Payer: Self-pay | Admitting: Family

## 2018-07-13 ENCOUNTER — Ambulatory Visit (HOSPITAL_COMMUNITY)
Admission: RE | Admit: 2018-07-13 | Discharge: 2018-07-13 | Disposition: A | Discharge status: Home / Self Care | Payer: Medicare Other | Source: Ambulatory Visit | Attending: Family | Admitting: Family

## 2018-07-13 ENCOUNTER — Ambulatory Visit (INDEPENDENT_AMBULATORY_CARE_PROVIDER_SITE_OTHER): Payer: Medicare Other | Admitting: Family

## 2018-07-13 ENCOUNTER — Other Ambulatory Visit: Payer: Self-pay

## 2018-07-13 VITALS — BP 132/77 | HR 82 | Resp 20 | Ht 70.0 in | Wt 160.8 lb

## 2018-07-13 DIAGNOSIS — R0989 Other specified symptoms and signs involving the circulatory and respiratory systems: Secondary | ICD-10-CM

## 2018-07-13 DIAGNOSIS — I779 Disorder of arteries and arterioles, unspecified: Secondary | ICD-10-CM

## 2018-07-13 DIAGNOSIS — Z862 Personal history of diseases of the blood and blood-forming organs and certain disorders involving the immune mechanism: Secondary | ICD-10-CM | POA: Diagnosis present

## 2018-07-13 DIAGNOSIS — Z87891 Personal history of nicotine dependence: Secondary | ICD-10-CM

## 2018-07-13 DIAGNOSIS — Z95828 Presence of other vascular implants and grafts: Secondary | ICD-10-CM | POA: Diagnosis not present

## 2018-07-13 NOTE — Progress Notes (Signed)
[facility] [place]   CC: Follow up peripheral artery occlusive disease  History of Present Illness [name] is a 72 y.o. male who is s/p failed fem-fem, L fem-AK, L fem-BK BP and successful Aortobifemoral bypass by Dr. Imogene Burn.  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 The patient's prior symptoms of intermittent claudication are resolved. The patient was able to walk on treadmill for 40 minutes, 3x/week, has a new job and has found it difficult to resume exercising. The patient's treatment regimen currently includes: maximal medical management.  He has a history of cervical spine fusion, his feet get numb after standing a while; he denies non healing wounds in LE's. He states that he needs another c-spine fusion. Pt states he has "some blockage" in his left ICA that his PCP had been following.  Pt states that years ago he was seen at [facility] ED for a TIA as manifested by he could not remember how to tie his tie.   He was performing pulmonary rehab 3 days/week for 2-3 years, has COPD.   He does not have claudication symptoms with walking, but has not been walking much due to numbness in feet and dyspnea from COPD.  He uses supplemental O2 at home.   Pt Diabetic: No Pt smoker: former smoker, quit 08/13/2010  Pt meds include: Statin :Yes ASA: No Other anticoagulants/antiplatelets: coumadin for history of HIT    Past Medical History:  Diagnosis Date  . Anxiety   . Clotting disorder (HCC)   . COPD (chronic obstructive pulmonary disease) (HCC)   . Coronary artery disease   . Degenerative disc disease   . Depression   . DVT (deep venous thrombosis) (HCC)   . Hyperlipidemia   . Leg fracture, left   . Leg fracture, right   . Peripheral arterial disease (HCC)   . PVD (peripheral vascular disease) (HCC)     Social History Social History   Tobacco Use  . Smoking status:  Former Smoker    Packs/day: 1.50    Last attempt to quit: 08/13/2010    Years since quitting: 7.9  . Smokeless tobacco: Former Engineer, water Use Topics  . Alcohol use: No  . Drug use: No    Family History Family History  Problem Relation Age of Onset  . Cancer Mother   . Emphysema Mother   . Lung cancer Mother   . Heart disease Father        Heart Disease before age 75  . Cancer Sister   . Cancer Brother   . Diabetes Brother   . Hyperlipidemia Brother   . Hypertension Brother   . Heart disease Brother        Heart Disease before age 7    Past Surgical History:  Procedure Laterality Date  . AORTA - BILATERAL FEMORAL ARTERY BYPASS GRAFT  11/10/2011   Procedure: AORTA BIFEMORAL BYPASS GRAFT;  Surgeon: Nilda Simmer, MD;  Location: [facility] OR;  Service: Vascular;  Laterality: N/A;  . aotogram  07/30/10  . CARDIOVASCULAR STRESS TEST  08/03/2010   EF 53%. NO ISCHEMIA  . FRACTURE SURGERY     pins left femur  . iliofemoral endarterectomy with bovine patch angioplasty  10/27/10  . NECK SURGERY    . PR VEIN BYPASS GRAFT,AORTO-FEM-POP    . redo r to l femoral-femoral bypass  10/27/10  . thombectomy and fem pop bypass 08/17/10  Allergies  Allergen Reactions  . Ciprofloxacin Hcl Shortness Of Breath and Rash    fever  . Vancomycin Shortness Of Breath and Rash    fever  . Heparin Other (See Comments)    Blood-clots and bleeding.    Current Outpatient Medications  Medication Sig Dispense Refill  . albuterol (PROVENTIL HFA;VENTOLIN HFA) 108 (90 BASE) MCG/ACT inhaler Inhale 2 puffs into the lungs every 6 (six) hours as needed. For shortness of breath.     Marland Kitchen atorvastatin (LIPITOR) 40 MG tablet Take 40 mg by mouth at bedtime.     . budesonide-formoterol (SYMBICORT) 160-4.5 MCG/ACT inhaler Inhale 2 puffs into the lungs 2 (two) times daily.      . Calcium-Vitamin D-Vitamin K [id number] MG-UNT-MCG CHEW Chew 2 tablets by mouth at bedtime.     Marland Kitchen CIALIS 20 MG tablet Take 20 mg  by mouth daily as needed. For erectile dysfunction.    . ferrous fumarate (HEMOCYTE - 106 MG FE) 325 (106 FE) MG TABS Take 1 tablet by mouth at bedtime.     . sertraline (ZOLOFT) 50 MG tablet Take 50 mg by mouth at bedtime.     . theophylline (THEODUR) 100 MG 12 hr tablet Take 100 mg by mouth 3 (three) times daily.    Marland Kitchen tiotropium (SPIRIVA) 18 MCG inhalation capsule Place 18 mcg into inhaler and inhale daily.      . traZODone (DESYREL) 50 MG tablet Take 25 mg by mouth at bedtime.     Marland Kitchen warfarin (COUMADIN) 1 MG tablet Take 2 mg by mouth See admin instructions. Takes on  Tues, Wed, Thurs Sat and Sun    . warfarin (COUMADIN) 3 MG tablet Take 3 mg by mouth See admin instructions. Takes 3mg  on Monday and Friday  2mg  on all other days     No current facility-administered medications for this visit.     ROS: See HPI for pertinent positives and negatives.   Physical Examination  Vitals:   07/13/18 1429  BP: 132/77  Pulse: 82  Resp: 20  SpO2: (!) 88%  Weight: 160 lb 12.8 oz (72.9 kg)  Height: 5\' 10"  (1.778 m)   Body mass index is 23.07 kg/m.  General: A&O x 3, WDWN  HEENT: no gross abnormalities  Pulmonary: Sym exp, respirations are slightly labored at rest, limited air movement in all fields, CTAB, no rales, rhonchi, or wheezing. Occassional dry cough. Cardiac: RRR, no detected murmur  Vascular: Vessel  Right  Left   Radial  Palpable  Palpable   Carotid  Palpable, no bruit  Palpable, +bruit   Aorta  Non-palpable  N/A   Femoral  2+Palpable  2+Palpable   Popliteal  Non-palpable  2+palpable   PT  Not Palpable  2+Palpable   DP  Not Palpable  Not Palpable    GI: soft, NTND, incision well healed without hernia  Musculoskeletal: Extremities without ischemic changes  Skin: no rashes, no cellulitis, no ulcers noted. Neurologic: A&O X 3; appropriate affect, Sensation is normal; MOTOR FUNCTION:  moving all extremities equally, motor strength 5/5 throughout.  Speech is fluent/normal. CN 2-12 intact. Psychiatric: Thought content is normal, mood appropriate for clinical situation.     ASSESSMENT: [name] is a 72 y.o. male who is s/p failed fem-fem, L fem-AK, L fem-BK BP and successful Aortobifemoral bypass. 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 The patient's prior symptoms of intermittent claudication are resolved.  His walking is limited by dyspnea from known COPD and numbness in his feet from what he believes are c-spine issues.   He has a left carotid bruit with a remote history of a TIA, no carotid duplex result on file; will check carotid duplex in a month   DATA  ABI (Date: 07/13/2018):  R:   ABI: 0.76 (was 0.83 on 05-20-17),   PT: bi  DP: bi  TBI:  0.76, toe pressure 115 (was 0.63)  L:   ABI: 0.66 (was 0.74),   PT: bi  DP: bi  TBI: 0.61, toe pressure 93 (was 0.50) Slight decline in bilateral ABI, moderate disease bilaterally with biphasic waveforms.    PLAN:  Based on the patient's vascular studies and examination, pt will return to clinic in 1 month with carotid duplex to evaluate left carotid bruit, 1 year with ABI's.  I advised him to notify us if he develops concerns re the circulation in his feet or legs.  Walk at least 30 minutes total daily.   I discussed in depth with the patient the nature of atherosclerosis, and emphasized the importance of maximal medical management including strict control of blood pressure, blood glucose, and lipid levels, obtaining regular exercise, and continued cessation of smoking.  The patient is aware that without maximal medical management the underlying atherosclerotic disease process will progress, limiting the benefit of any interventions.  The patient was given information about PAD including signs, symptoms, treatment, what symptoms should prompt the patient to seek immediate medical  care, and risk reduction measures to take.  Charisse March, RN, MSN, FNP-C [facility] [place] Phone: [phone]  Clinic MD: Darrick Penna  07/13/18 2:43 PM

## 2018-07-13 NOTE — Patient Instructions (Addendum)
Peripheral Vascular Disease Peripheral vascular disease (PVD) is a disease of the blood vessels that are not part of your heart and brain. A simple term for PVD is poor circulation. In most cases, PVD narrows the blood vessels that carry blood from your heart to the rest of your body. This can result in a decreased supply of blood to your arms, legs, and internal organs, like your stomach or kidneys. However, it most often affects a person's lower legs and feet. There are two types of PVD.  Organic PVD. This is the more common type. It is caused by damage to the structure of blood vessels.  Functional PVD. This is caused by conditions that make blood vessels contract and tighten (spasm).  Without treatment, PVD tends to get worse over time. PVD can also lead to acute ischemic limb. This is when an arm or limb suddenly has trouble getting enough blood. This is a medical emergency. Follow these instructions at home:  Take medicines only as told by your doctor.  Do not use any tobacco products, including cigarettes, chewing tobacco, or electronic cigarettes. If you need help quitting, ask your doctor.  Lose weight if you are overweight, and maintain a healthy weight as told by your doctor.  Eat a diet that is low in fat and cholesterol. If you need help, ask your doctor.  Exercise regularly. Ask your doctor for some good activities for you.  Take good care of your feet. ? Wear comfortable shoes that fit well. ? Check your feet often for any cuts or sores. Contact a doctor if:  You have cramps in your legs while walking.  You have leg pain when you are at rest.  You have coldness in a leg or foot.  Your skin changes.  You are unable to get or have an erection (erectile dysfunction).  You have cuts or sores on your feet that are not healing. Get help right away if:  Your arm or leg turns cold and blue.  Your arms or legs become red, warm, swollen, painful, or numb.  You have  chest pain or trouble breathing.  You suddenly have weakness in your face, arm, or leg.  You become very confused or you cannot speak.  You suddenly have a very bad headache.  You suddenly cannot see. This information is not intended to replace advice given to you by your health care provider. Make sure you discuss any questions you have with your health care provider. Document Released: 12/29/2009 Document Revised: 03/11/2016 Document Reviewed: 03/14/2014 Elsevier Interactive Patient Education  2017 [employer] Inc[employer]       Stroke Prevention Some health problems and behaviors may make it more likely for you to have a stroke. Below are ways to lessen your risk of having a stroke.  Be active for at least 30 minutes on most or all days.  Do not smoke. Try not to be around others who smoke.  Do not drink too much alcohol. ? Do not have more than 2 drinks a day if you are a man. ? Do not have more than 1 drink a day if you are a woman and are not pregnant.  Eat healthy foods, such as fruits and vegetables. If you were put on a specific diet, follow the diet as told.  Keep your cholesterol levels under control through diet and medicines. Look for foods that are low in saturated fat, trans fat, cholesterol, and are high in fiber.  If you have diabetes, follow   all diet plans and take your medicine as told.  Ask your doctor if you need treatment to lower your blood pressure. If you have high blood pressure (hypertension), follow all diet plans and take your medicine as told by your doctor.  If you are 18-39 years old, have your blood pressure checked every 3-5 years. If you are age 40 or older, have your blood pressure checked every year.  Keep a healthy weight. Eat foods that are low in calories, salt, saturated fat, trans fat, and cholesterol.  Do not take drugs.  Avoid birth control pills, if this applies. Talk to your doctor about the risks of taking birth control pills.  Talk to  your doctor if you have sleep problems (sleep apnea).  Take all medicine as told by your doctor. ? You may be told to take aspirin or blood thinner medicine. Take this medicine as told by your doctor. ? Understand your medicine instructions.  Make sure any other conditions you have are being taken care of.  Get help right away if:  You suddenly lose feeling (you feel numb) or have weakness in your face, arm, or leg.  Your face or eyelid hangs down to one side.  You suddenly feel confused.  You have trouble talking (aphasia) or understanding what people are saying.  You suddenly have trouble seeing in one or both eyes.  You suddenly have trouble walking.  You are dizzy.  You lose your balance or your movements are clumsy (uncoordinated).  You suddenly have a very bad headache and you do not know the cause.  You have new chest pain.  Your heart feels like it is fluttering or skipping a beat (irregular heartbeat). Do not wait to see if the symptoms above go away. Get help right away. Call your local emergency services (911 in [place] Do not drive yourself to the hospital. This information is not intended to replace advice given to you by your health care provider. Make sure you discuss any questions you have with your health care provider. Document Released: 04/04/2012 Document Revised: 03/11/2016 Document Reviewed: 04/06/2013 Elsevier Interactive Patient Education  2018 Else[employer] Inc.  

## 2018-07-19 ENCOUNTER — Other Ambulatory Visit: Payer: Self-pay

## 2018-07-19 DIAGNOSIS — R0989 Other specified symptoms and signs involving the circulatory and respiratory systems: Secondary | ICD-10-CM

## 2018-08-11 ENCOUNTER — Encounter: Payer: Self-pay | Admitting: Family

## 2018-08-11 ENCOUNTER — Ambulatory Visit (INDEPENDENT_AMBULATORY_CARE_PROVIDER_SITE_OTHER): Payer: Medicare Other | Admitting: Family

## 2018-08-11 ENCOUNTER — Ambulatory Visit (HOSPITAL_COMMUNITY)
Admission: RE | Admit: 2018-08-11 | Discharge: 2018-08-11 | Disposition: A | Discharge status: Home / Self Care | Payer: Medicare Other | Source: Ambulatory Visit | Attending: Family | Admitting: Family

## 2018-08-11 VITALS — BP 121/75 | HR 86 | Temp 97.0°F | Resp 16 | Ht 70.0 in | Wt 160.0 lb

## 2018-08-11 DIAGNOSIS — Z862 Personal history of diseases of the blood and blood-forming organs and certain disorders involving the immune mechanism: Secondary | ICD-10-CM

## 2018-08-11 DIAGNOSIS — I779 Disorder of arteries and arterioles, unspecified: Secondary | ICD-10-CM

## 2018-08-11 DIAGNOSIS — R0989 Other specified symptoms and signs involving the circulatory and respiratory systems: Secondary | ICD-10-CM | POA: Diagnosis not present

## 2018-08-11 DIAGNOSIS — I6522 Occlusion and stenosis of left carotid artery: Secondary | ICD-10-CM | POA: Diagnosis not present

## 2018-08-11 DIAGNOSIS — I6521 Occlusion and stenosis of right carotid artery: Secondary | ICD-10-CM

## 2018-08-11 DIAGNOSIS — Z87891 Personal history of nicotine dependence: Secondary | ICD-10-CM

## 2018-08-11 DIAGNOSIS — Z95828 Presence of other vascular implants and grafts: Secondary | ICD-10-CM | POA: Diagnosis not present

## 2018-08-11 NOTE — Patient Instructions (Addendum)
Stroke Prevention Some health problems and behaviors may make it more likely for you to have a stroke. Below are ways to lessen your risk of having a stroke.  Be active for at least 30 minutes on most or all days.  Do not smoke. Try not to be around others who smoke.  Do not drink too much alcohol. ? Do not have more than 2 drinks a day if you are a man. ? Do not have more than 1 drink a day if you are a woman and are not pregnant.  Eat healthy foods, such as fruits and vegetables. If you were put on a specific diet, follow the diet as told.  Keep your cholesterol levels under control through diet and medicines. Look for foods that are low in saturated fat, trans fat, cholesterol, and are high in fiber.  If you have diabetes, follow all diet plans and take your medicine as told.  Ask your doctor if you need treatment to lower your blood pressure. If you have high blood pressure (hypertension), follow all diet plans and take your medicine as told by your doctor.  If you are 18-39 years old, have your blood pressure checked every 3-5 years. If you are age 40 or older, have your blood pressure checked every year.  Keep a healthy weight. Eat foods that are low in calories, salt, saturated fat, trans fat, and cholesterol.  Do not take drugs.  Avoid birth control pills, if this applies. Talk to your doctor about the risks of taking birth control pills.  Talk to your doctor if you have sleep problems (sleep apnea).  Take all medicine as told by your doctor. ? You may be told to take aspirin or blood thinner medicine. Take this medicine as told by your doctor. ? Understand your medicine instructions.  Make sure any other conditions you have are being taken care of.  Get help right away if:  You suddenly lose feeling (you feel numb) or have weakness in your face, arm, or leg.  Your face or eyelid hangs down to one side.  You suddenly feel confused.  You have trouble talking  (aphasia) or understanding what people are saying.  You suddenly have trouble seeing in one or both eyes.  You suddenly have trouble walking.  You are dizzy.  You lose your balance or your movements are clumsy (uncoordinated).  You suddenly have a very bad headache and you do not know the cause.  You have new chest pain.  Your heart feels like it is fluttering or skipping a beat (irregular heartbeat). Do not wait to see if the symptoms above go away. Get help right away. Call your local emergency services (911 in [place] Do not drive yourself to the hospital. This information is not intended to replace advice given to you by your health care provider. Make sure you discuss any questions you have with your health care provider. Document Released: 04/04/2012 Document Revised: 03/11/2016 Document Reviewed: 04/06/2013 Elsevier Interactive Patient Education  2018 [employer] Inc.     Peripheral Vascular Disease Peripheral vascular disease (PVD) is a disease of the blood vessels that are not part of your heart and brain. A simple term for PVD is poor circulation. In most cases, PVD narrows the blood vessels that carry blood from your heart to the rest of your body. This can result in a decreased supply of blood to your arms, legs, and internal organs, like your stomach or kidneys. However, it most often affects   a person's lower legs and feet. There are two types of PVD.  Organic PVD. This is the more common type. It is caused by damage to the structure of blood vessels.  Functional PVD. This is caused by conditions that make blood vessels contract and tighten (spasm).  Without treatment, PVD tends to get worse over time. PVD can also lead to acute ischemic limb. This is when an arm or limb suddenly has trouble getting enough blood. This is a medical emergency. Follow these instructions at home:  Take medicines only as told by your doctor.  Do not use any tobacco products, including  cigarettes, chewing tobacco, or electronic cigarettes. If you need help quitting, ask your doctor.  Lose weight if you are overweight, and maintain a healthy weight as told by your doctor.  Eat a diet that is low in fat and cholesterol. If you need help, ask your doctor.  Exercise regularly. Ask your doctor for some good activities for you.  Take good care of your feet. ? Wear comfortable shoes that fit well. ? Check your feet often for any cuts or sores. Contact a doctor if:  You have cramps in your legs while walking.  You have leg pain when you are at rest.  You have coldness in a leg or foot.  Your skin changes.  You are unable to get or have an erection (erectile dysfunction).  You have cuts or sores on your feet that are not healing. Get help right away if:  Your arm or leg turns cold and blue.  Your arms or legs become red, warm, swollen, painful, or numb.  You have chest pain or trouble breathing.  You suddenly have weakness in your face, arm, or leg.  You become very confused or you cannot speak.  You suddenly have a very bad headache.  You suddenly cannot see. This information is not intended to replace advice given to you by your health care provider. Make sure you discuss any questions you have with your health care provider. Document Released: 12/29/2009 Document Revised: 03/11/2016 Document Reviewed: 03/14/2014 Elsevier Interactive Patient Education  2017 [employer] Inc.  

## 2018-08-11 NOTE — Progress Notes (Signed)
Pt. Is here for 1 month follow-up.

## 2018-08-11 NOTE — Progress Notes (Signed)
[facility] [place]   CC: Follow up peripheral artery occlusive disease  History of Present Illness [name] is a 72 y.o. male who is s/p failed fem-fem, L fem-AK, L fem-BK BP and successful Aortobifemoral bypass by Dr. Imogene Burn.  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 The patient's prior symptoms of intermittent claudication are resolved. The patient was able to walk on treadmill for 40 minutes, 3x/week, has a new job and has found it difficult to resume exercising. The patient's treatment regimen currently includes: maximal medical management.  He has a history ofcervicalspine fusion, his feet get numb after standing a while; he denies non healing wounds in LE's. He states that he needs another c-spine fusion. Pt states he has "some blockage" in his left ICA that his PCP had been following.  Pt states that years ago he was seen at [facility] ED for a TIA as manifested by he could not remember how to tie his tie.   Hewas attendingpulmonary rehab 3 days/week for 2-3 years, has COPD.   He does not have claudication symptoms with walking, but has not been walking much due to numbness in feet and dyspnea from COPD. He uses supplemental O2 at home.   Diabetic: No Tobacco use: former smoker, quit 08/13/2010  Pt meds include: Statin :Yes ASA: No Other anticoagulants/antiplatelets: coumadin for history of HIT     Past Medical History:  Diagnosis Date  . Anxiety   . Clotting disorder (HCC)   . COPD (chronic obstructive pulmonary disease) (HCC)   . Coronary artery disease   . Degenerative disc disease   . Depression   . DVT (deep venous thrombosis) (HCC)   . Hyperlipidemia   . Leg fracture, left   . Leg fracture, right   . Peripheral arterial disease (HCC)   . PVD (peripheral vascular disease) (HCC)     Social History Social History   Tobacco Use  . Smoking status:  Former Smoker    Packs/day: 1.50    Last attempt to quit: 08/13/2010    Years since quitting: 8.0  . Smokeless tobacco: Former Engineer, water Use Topics  . Alcohol use: No  . Drug use: No    Family History Family History  Problem Relation Age of Onset  . Cancer Mother   . Emphysema Mother   . Lung cancer Mother   . Heart disease Father        Heart Disease before age 63  . Cancer Sister   . Cancer Brother   . Diabetes Brother   . Hyperlipidemia Brother   . Hypertension Brother   . Heart disease Brother        Heart Disease before age 55    Past Surgical History:  Procedure Laterality Date  . AORTA - BILATERAL FEMORAL ARTERY BYPASS GRAFT  11/10/2011   Procedure: AORTA BIFEMORAL BYPASS GRAFT;  Surgeon: Nilda Simmer, MD;  Location: [facility] OR;  Service: Vascular;  Laterality: N/A;  . aotogram  07/30/10  . CARDIOVASCULAR STRESS TEST  08/03/2010   EF 53%. NO ISCHEMIA  . FRACTURE SURGERY     pins left femur  . iliofemoral endarterectomy with bovine patch angioplasty  10/27/10  . NECK SURGERY    . PR VEIN BYPASS GRAFT,AORTO-FEM-POP    . redo r to l femoral-femoral bypass  10/27/10  . thombectomy and fem pop bypass 08/17/10      Allergies  Allergen Reactions  . Ciprofloxacin Hcl Shortness Of Breath and Rash    fever  . Vancomycin Shortness Of Breath and Rash    fever  . Heparin Other (See Comments)    Blood-clots and bleeding.    Current Outpatient Medications  Medication Sig Dispense Refill  . albuterol (PROVENTIL HFA;VENTOLIN HFA) 108 (90 BASE) MCG/ACT inhaler Inhale 2 puffs into the lungs every 6 (six) hours as needed. For shortness of breath.     Marland Kitchen atorvastatin (LIPITOR) 40 MG tablet Take 40 mg by mouth at bedtime.     . budesonide-formoterol (SYMBICORT) 160-4.5 MCG/ACT inhaler Inhale 2 puffs into the lungs 2 (two) times daily.      . Calcium-Vitamin D-Vitamin K [id number] MG-UNT-MCG CHEW Chew 2 tablets by mouth at bedtime.     Marland Kitchen CIALIS 20 MG tablet Take 20 mg  by mouth daily as needed. For erectile dysfunction.    . ferrous fumarate (HEMOCYTE - 106 MG FE) 325 (106 FE) MG TABS Take 1 tablet by mouth at bedtime.     . sertraline (ZOLOFT) 50 MG tablet Take 50 mg by mouth at bedtime.     . theophylline (THEODUR) 100 MG 12 hr tablet Take 100 mg by mouth 3 (three) times daily.    Marland Kitchen tiotropium (SPIRIVA) 18 MCG inhalation capsule Place 18 mcg into inhaler and inhale daily.      . traZODone (DESYREL) 50 MG tablet Take 25 mg by mouth at bedtime.     Marland Kitchen warfarin (COUMADIN) 1 MG tablet Take 2 mg by mouth See admin instructions. Takes on  Tues, Wed, Thurs Sat and Sun    . warfarin (COUMADIN) 3 MG tablet Take 3 mg by mouth See admin instructions. Takes 3mg  on Monday and Friday  2mg  on all other days     No current facility-administered medications for this visit.     ROS: See HPI for pertinent positives and negatives.   Physical Examination  Vitals:   08/11/18 1333 08/11/18 1336  BP: 125/66 121/75  Pulse: 86   Resp: 16   Temp: (!) 97 F (36.1 C)   TempSrc: Oral   SpO2: 91% 95%  Weight: 160 lb (72.6 kg)   Height: 5\' 10"  (1.778 m)    Body mass index is 22.96 kg/m.  General: A&O x 3, WDWN  HEENT: no gross abnormalities  Pulmonary: Sym exp,respirations are slightly labored at rest,limited air movement in all fields, no rales, rhonchi, or wheezing. Occassional dry cough. Cardiac: RRR, no detected murmur  Vascular: Vessel  Right  Left   Radial  Palpable  Palpable   Carotid  Palpable, +bruit  Palpable, +bruit   Aorta  Non-palpable  N/A   Femoral  2+Palpable  2+Palpable   Popliteal  Non-palpable  2+palpable   PT  NotPalpable  2+Palpable   DP  NotPalpable  NotPalpable    GI: soft, NTND, incision well healed without hernia  Musculoskeletal: Extremities without ischemic changes  Skin: no rashes, no cellulitis, no ulcers noted. Neurologic: A&O X 3; appropriate affect, Sensation is normal; MOTOR FUNCTION:  moving  all extremities equally, motor strength 5/5 throughout. Speech is fluent/normal. CN 2-12 intact. Psychiatric: Thought content is normal, mood appropriate for clinical situation.      ASSESSMENT: [name] is a 72 y.o. male who is s/p failed fem-fem, L fem-AK, L fem-BK BP and successful Aortobifemoral bypass. 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 The patient's  prior symptoms of intermittent claudication are resolved.   His walking is limited by dyspnea from known COPD and numbness in his feet from what he believes are c-spine issues.  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    DATA  Carotid Duplex (08-11-18): Right ICA: 40-59% stenosis Left ICA: occluded Bilateral vertebral artery flow is antegrade.  Bilateral subclavian artery waveforms are normal.  No previous exam result on file for comparison.    ABI (Date: 07/13/2018):  R:  ? ABI: 0.76 (was 0.83 on 05-20-17),  ? PT: bi ? DP: bi ? TBI:  0.76, toe pressure 115 (was 0.63)  L:  ? ABI: 0.66 (was 0.74),  ? PT: bi ? DP: bi ? TBI: 0.61, toe pressure 93 (was 0.50) Slight decline in bilateral ABI, moderate disease bilaterally with biphasic waveforms.     PLAN:  Based on the patient's vascular studies and examination, pt will return to clinic in 1 year with ABI's and carotid duplex.  I advised him to notify us if he develops concerns re the circulation in his feet or legs.  Walk at least 30 minutes total daily in a safe environment.    I discussed in depth with the patient the nature of atherosclerosis, and emphasized the importance of maximal medical management including strict control of blood pressure, blood glucose, and lipid levels, obtaining regular exercise, and continued cessation of smoking.  The patient is aware that without maximal medical management the  underlying atherosclerotic disease process will progress, limiting the benefit of any interventions.  The patient was given information about PAD including signs, symptoms, treatment, what symptoms should prompt the patient to seek immediate medical care, and risk reduction measures to take.  Charisse March, RN, MSN, FNP-C [facility] Phone: [phone]  Clinic MD: Randie Heinz  08/11/18 2:09 PM
